# Patient Record
Sex: Female | Born: 1980 | Race: White | Hispanic: No | Marital: Married | Smoking: Former smoker
Health system: Southern US, Community
[De-identification: ages and names within clinical notes are randomized; demographics above are authoritative.]

## PROBLEM LIST (undated history)

## (undated) DIAGNOSIS — Z9889 Other specified postprocedural states: Secondary | ICD-10-CM

## (undated) DIAGNOSIS — J189 Pneumonia, unspecified organism: Secondary | ICD-10-CM

## (undated) DIAGNOSIS — C73 Malignant neoplasm of thyroid gland: Secondary | ICD-10-CM

## (undated) DIAGNOSIS — E039 Hypothyroidism, unspecified: Secondary | ICD-10-CM

## (undated) DIAGNOSIS — D649 Anemia, unspecified: Secondary | ICD-10-CM

## (undated) DIAGNOSIS — T8859XA Other complications of anesthesia, initial encounter: Secondary | ICD-10-CM

## (undated) DIAGNOSIS — T4145XA Adverse effect of unspecified anesthetic, initial encounter: Secondary | ICD-10-CM

## (undated) DIAGNOSIS — R112 Nausea with vomiting, unspecified: Secondary | ICD-10-CM

## (undated) HISTORY — PX: BILATERAL SALPINGECTOMY: SHX5743

## (undated) HISTORY — PX: WISDOM TOOTH EXTRACTION: SHX21

## (undated) HISTORY — DX: Malignant neoplasm of thyroid gland: C73

---

## 2001-03-22 ENCOUNTER — Other Ambulatory Visit: Admission: RE | Admit: 2001-03-22 | Discharge: 2001-03-22 | Payer: Self-pay | Admitting: Family Medicine

## 2001-03-22 ENCOUNTER — Other Ambulatory Visit: Admission: RE | Admit: 2001-03-22 | Discharge: 2001-03-22 | Payer: Self-pay | Admitting: *Deleted

## 2004-07-17 ENCOUNTER — Inpatient Hospital Stay: Payer: Self-pay

## 2005-12-15 ENCOUNTER — Inpatient Hospital Stay: Payer: Self-pay

## 2010-01-04 ENCOUNTER — Emergency Department: Payer: Self-pay | Admitting: Emergency Medicine

## 2012-07-18 HISTORY — PX: COLPOSCOPY: SHX161

## 2013-05-11 ENCOUNTER — Inpatient Hospital Stay: Payer: Self-pay | Admitting: Obstetrics and Gynecology

## 2013-05-11 LAB — CBC WITH DIFFERENTIAL/PLATELET
Basophil #: 0 x10 3/mm 3 (ref 0.0–0.1)
Basophil %: 0.6 %
Eosinophil #: 0.1 x10 3/mm 3 (ref 0.0–0.7)
Eosinophil %: 0.8 %
HCT: 32.3 % — ABNORMAL LOW (ref 35.0–47.0)
HGB: 10.7 g/dL — ABNORMAL LOW (ref 12.0–16.0)
Lymphocyte %: 18.5 %
Lymphs Abs: 1.4 x10 3/mm 3 (ref 1.0–3.6)
MCH: 27 pg (ref 26.0–34.0)
MCHC: 33.1 g/dL (ref 32.0–36.0)
MCV: 82 fL (ref 80–100)
Monocyte #: 0.6 x10 3/mm (ref 0.2–0.9)
Monocyte %: 8.3 %
Neutrophil #: 5.6 x10 3/mm 3 (ref 1.4–6.5)
Neutrophil %: 71.8 %
Platelet: 179 x10 3/mm 3 (ref 150–440)
RBC: 3.96 X10 6/mm 3 (ref 3.80–5.20)
RDW: 15.5 % — ABNORMAL HIGH (ref 11.5–14.5)
WBC: 7.8 x10 3/mm 3 (ref 3.6–11.0)

## 2013-05-13 LAB — HEMATOCRIT: HCT: 31.9 % — ABNORMAL LOW (ref 35.0–47.0)

## 2014-05-09 ENCOUNTER — Ambulatory Visit: Payer: Self-pay | Admitting: Family Medicine

## 2014-05-09 DIAGNOSIS — K760 Fatty (change of) liver, not elsewhere classified: Secondary | ICD-10-CM | POA: Insufficient documentation

## 2014-08-01 LAB — OB RESULTS CONSOLE RUBELLA ANTIBODY, IGM: Rubella: IMMUNE

## 2014-08-01 LAB — OB RESULTS CONSOLE PLATELET COUNT: PLATELETS: 224 10*3/uL

## 2014-08-01 LAB — OB RESULTS CONSOLE HGB/HCT, BLOOD
HCT: 38 %
Hemoglobin: 12.7 g/dL

## 2014-08-01 LAB — OB RESULTS CONSOLE HEPATITIS B SURFACE ANTIGEN: Hepatitis B Surface Ag: NEGATIVE

## 2014-08-01 LAB — OB RESULTS CONSOLE RPR: RPR: NONREACTIVE

## 2014-08-01 LAB — OB RESULTS CONSOLE HIV ANTIBODY (ROUTINE TESTING): HIV: NONREACTIVE

## 2014-08-01 LAB — OB RESULTS CONSOLE VARICELLA ZOSTER ANTIBODY, IGG: Varicella: IMMUNE

## 2014-08-06 DIAGNOSIS — E669 Obesity, unspecified: Secondary | ICD-10-CM | POA: Insufficient documentation

## 2014-08-06 DIAGNOSIS — R87618 Other abnormal cytological findings on specimens from cervix uteri: Secondary | ICD-10-CM | POA: Insufficient documentation

## 2014-08-06 DIAGNOSIS — E559 Vitamin D deficiency, unspecified: Secondary | ICD-10-CM | POA: Insufficient documentation

## 2014-10-18 DIAGNOSIS — C73 Malignant neoplasm of thyroid gland: Secondary | ICD-10-CM

## 2014-10-18 HISTORY — DX: Malignant neoplasm of thyroid gland: C73

## 2014-10-18 NOTE — L&D Delivery Note (Signed)
Delivery Note At 4:44 AM a viable female was delivered via Vaginal, Spontaneous Delivery (Presentation: Right Occiput Anterior).  APGAR: 8, 8; weight 9 lb 9 oz (4338 g).   Placenta status: Intact, Spontaneous.  Cord: 3 vessels with the following complications: None.  Cord pH: n/a  Quick second stage. Infant to maternal abdomen for delayed cord clamping, cut by FOB after pulsation stopped. Infant to skin-to-skin with mother.   84 Name: undecided Anesthesia: Spinal  Episiotomy: None Lacerations: 1st degree Suture Repair: N/A Est. Blood Loss (mL): 500  Mom to postpartum.  Baby to Couplet care / Skin to Skin.   Burlene Arnt 03/11/2015, 5:15 AM

## 2015-02-17 LAB — OB RESULTS CONSOLE GBS: GBS: NEGATIVE

## 2015-03-10 ENCOUNTER — Inpatient Hospital Stay
Admission: EM | Admit: 2015-03-10 | Discharge: 2015-03-13 | DRG: 775 | Disposition: A | Discharge status: Home / Self Care | Payer: 59 | Attending: Obstetrics and Gynecology | Admitting: Obstetrics and Gynecology

## 2015-03-10 DIAGNOSIS — O09523 Supervision of elderly multigravida, third trimester: Secondary | ICD-10-CM | POA: Diagnosis not present

## 2015-03-10 DIAGNOSIS — M549 Dorsalgia, unspecified: Secondary | ICD-10-CM | POA: Diagnosis not present

## 2015-03-10 DIAGNOSIS — Z3A39 39 weeks gestation of pregnancy: Secondary | ICD-10-CM | POA: Diagnosis present

## 2015-03-10 DIAGNOSIS — Z6841 Body Mass Index (BMI) 40.0 and over, adult: Secondary | ICD-10-CM | POA: Diagnosis not present

## 2015-03-10 DIAGNOSIS — O99214 Obesity complicating childbirth: Principal | ICD-10-CM | POA: Diagnosis present

## 2015-03-10 MED ORDER — OXYTOCIN 40 UNITS IN LACTATED RINGERS INFUSION - SIMPLE MED
1.0000 m[IU]/min | INTRAVENOUS | Status: DC
  Administered 2015-03-10: 1 m[IU]/min via INTRAVENOUS

## 2015-03-10 MED ORDER — LACTATED RINGERS IV SOLN
INTRAVENOUS | Status: DC
  Administered 2015-03-10: via INTRAVENOUS

## 2015-03-10 MED ORDER — OXYTOCIN 40 UNITS IN LACTATED RINGERS INFUSION - SIMPLE MED
INTRAVENOUS | Status: AC
  Administered 2015-03-10: 1 m[IU]/min via INTRAVENOUS
  Filled 2015-03-10: qty 1000

## 2015-03-10 MED ORDER — OXYTOCIN BOLUS FROM INFUSION
500.0000 mL | INTRAVENOUS | Status: DC
  Administered 2015-03-11: 500 mL via INTRAVENOUS

## 2015-03-10 MED ORDER — OXYTOCIN 40 UNITS IN LACTATED RINGERS INFUSION - SIMPLE MED
62.5000 mL/h | INTRAVENOUS | Status: DC
  Administered 2015-03-11: 40 [IU] via INTRAVENOUS

## 2015-03-10 MED ORDER — ACETAMINOPHEN 325 MG PO TABS: 650.0000 mg | ORAL_TABLET | ORAL | Status: DC | PRN

## 2015-03-10 MED ORDER — TERBUTALINE SULFATE 1 MG/ML IJ SOLN: 0.2500 mg | Freq: Once | INTRAMUSCULAR | Status: AC | PRN

## 2015-03-10 MED ORDER — LACTATED RINGERS IV SOLN: 500.0000 mL | INTRAVENOUS | Status: DC | PRN

## 2015-03-10 MED ORDER — THYROID 120 MG PO TABS
120.0000 mg | ORAL_TABLET | Freq: Every day | ORAL | Status: DC
  Filled 2015-03-10 (×2): qty 1

## 2015-03-10 NOTE — H&P (Signed)
Obstetric History and Physical  Brooke Gordon is a 34 y.o. [id number] with Estimated Date of Delivery: 03/14/15 per LMP & 8 wk [place] who presents at [redacted]w[redacted]d  presenting for IOL for BMI > 40 and h/o macrosomia. Patient states she has been having mild contractions since exam at office earlier today, no vaginal bleeding, intact membranes, with active fetal movement.    Prenatal Course Source of Care: [facility]  with onset of care at 8 weeks Pregnancy complications or risks:There are no active problems to display for this patient.  She plans to breastfeed She desires vasectomy for postpartum contraception.   Prenatal labs and studies: ABO, Rh: O neg  Antibody: neg  Rubella: Immune Varicella: Immune RPR:  NR HBsAg:  neg HIV: neg GC/CT: neg/neg GBS: neg 1 hr Glucola: 28 wk 1 hr= 150, 3 hr WNL except 1 value   Genetic screening: Negative Informaseq, XX    Prenatal Transfer Tool   PMHx: Hypothyroidism PSHx: wisdom teeth, colposcopy  OB History  Gravida Para Term Preterm AB SAB TAB Ectopic Multiple Living  5 4 4  0 0 0 0 0 0 4    # Outcome Date GA Lbr Len/2nd Weight Sex Delivery Anes PTL Lv  5 Current           4 Term 05/12/13 [redacted]w[redacted]d  4.309 kg (9 lb 8 oz) F Vag-Spont     3 Term 2007 [redacted]w[redacted]d  4.451 kg (9 lb 13 oz) M Vag-Spont     2 Term 2005 [redacted]w[redacted]d  4.366 kg (9 lb 10 oz) M Vag-Spont     1 Term 10/18/97 100w0d  3.912 kg (8 lb 10 oz) F Vag-Spont         History   Social History  . Marital Status: Single    Spouse Name: N/A  . Number of Children: N/A  . Years of Education: N/A   Social History Main Topics  . Smoking status: Not on file  . Smokeless tobacco: Not on file  . Alcohol Use: Not on file  . Drug Use: Not on file  . Sexual Activity: Not on file   Other Topics Concern  . None   Social History Narrative  . None  Denies tobacco, etoh or drug use  No family history on file.  Prescriptions prior to admission  Medication Sig Dispense Refill Last Dose  . Prenatal Vit-Fe  Fumarate-FA (MULTIVITAMIN-PRENATAL) 27-0.8 MG TABS tablet Take 1 tablet by mouth daily at 12 noon.   03/10/2015 at Unknown time  . thyroid (ARMOUR) 120 MG tablet Take 120 mg by mouth daily before breakfast.   03/10/2015 at Unknown time    Allergies: Codeine, intolerance to any narcotics  Review of Systems: Negative except for what is mentioned in HPI.  Physical Exam: LMP 06/07/2014 GENERAL: Well-developed, well-nourished female in no acute distress.  LUNGS: Clear to auscultation bilaterally.  HEART: Regular rate and rhythm. ABDOMEN: Soft, nontender, nondistended, gravid. EXTREMITIES: Nontender, no edema Cervical Exam: Dilatation 4 cm   Effacement 75%   Station -2, ballotable  (changed from 1-2/75/-3 in office) Presentation: cephalic FHT: Category: 1 Baseline rate 145 bpm   Variability moderate  Accelerations present   Decelerations none Contractions: Every  2-5  mins   Pertinent Labs/Studies:   No results found for this or any previous visit (from the past 24 hour(s)).  Assessment : IUP at [redacted]w[redacted]d early labor, scheduled for IOL for BMI > 40 and h/o macrosomia  Plan: Given change in cervix from previous exam, offered  pt to let her rest and start IOL with pitocin overnight. Pt declines and agrees to start Pitocin tonight.  Epidural when appropriate, declines narcotics in labor or postpartum d/t intolerance Anticipate vaginal delivery

## 2015-03-11 ENCOUNTER — Inpatient Hospital Stay: Payer: 59 | Admitting: Anesthesiology

## 2015-03-11 ENCOUNTER — Encounter: Payer: Self-pay | Admitting: Anesthesiology

## 2015-03-11 LAB — CBC
HEMATOCRIT: 35.2 % (ref 35.0–47.0)
Hemoglobin: 11.3 g/dL — ABNORMAL LOW (ref 12.0–16.0)
MCH: 26.1 pg (ref 26.0–34.0)
MCHC: 32.1 g/dL (ref 32.0–36.0)
MCV: 81 fL (ref 80.0–100.0)
Platelets: 195 10*3/uL (ref 150–440)
RBC: 4.35 MIL/uL (ref 3.80–5.20)
RDW: 14.8 % — AB (ref 11.5–14.5)
WBC: 11.6 10*3/uL — ABNORMAL HIGH (ref 3.6–11.0)

## 2015-03-11 LAB — SAMPLE TO BLOOD BANK

## 2015-03-11 MED ORDER — ONDANSETRON HCL 4 MG/2ML IJ SOLN
4.0000 mg | INTRAMUSCULAR | Status: DC | PRN
  Administered 2015-03-11: 4 mg via INTRAVENOUS
  Filled 2015-03-11: qty 2

## 2015-03-11 MED ORDER — DIBUCAINE 1 % RE OINT: 1.0000 "application " | TOPICAL_OINTMENT | RECTAL | Status: DC | PRN

## 2015-03-11 MED ORDER — IBUPROFEN 600 MG PO TABS
600.0000 mg | ORAL_TABLET | Freq: Four times a day (QID) | ORAL | Status: DC
  Administered 2015-03-11 – 2015-03-13 (×8): 600 mg via ORAL
  Filled 2015-03-11 (×9): qty 1

## 2015-03-11 MED ORDER — FERROUS SULFATE 325 (65 FE) MG PO TABS
325.0000 mg | ORAL_TABLET | Freq: Every day | ORAL | Status: DC
  Administered 2015-03-12: 325 mg via ORAL
  Filled 2015-03-11: qty 1

## 2015-03-11 MED ORDER — FENTANYL CITRATE (PF) 100 MCG/2ML IJ SOLN: 25.0000 ug | INTRAMUSCULAR | Status: DC | PRN

## 2015-03-11 MED ORDER — ACETAMINOPHEN 325 MG PO TABS
650.0000 mg | ORAL_TABLET | ORAL | Status: DC | PRN
Start: 2015-03-11 — End: 2015-03-13

## 2015-03-11 MED ORDER — CARBOPROST TROMETHAMINE 250 MCG/ML IM SOLN
INTRAMUSCULAR | Status: AC
  Filled 2015-03-11: qty 1

## 2015-03-11 MED ORDER — LACTATED RINGERS IV SOLN: INTRAVENOUS | Status: DC

## 2015-03-11 MED ORDER — SIMETHICONE 80 MG PO CHEW
80.0000 mg | CHEWABLE_TABLET | ORAL | Status: DC | PRN
  Filled 2015-03-11: qty 1

## 2015-03-11 MED ORDER — BENZOCAINE-MENTHOL 20-0.5 % EX AERO: 1.0000 "application " | INHALATION_SPRAY | CUTANEOUS | Status: DC | PRN

## 2015-03-11 MED ORDER — ONDANSETRON HCL 4 MG PO TABS
4.0000 mg | ORAL_TABLET | ORAL | Status: DC | PRN
  Filled 2015-03-11: qty 1

## 2015-03-11 MED ORDER — BUPIVACAINE HCL (PF) 0.75 % IJ SOLN
INTRAMUSCULAR | Status: DC | PRN
  Administered 2015-03-11: .5 mL via INTRATHECAL

## 2015-03-11 MED ORDER — OXYTOCIN 40 UNITS IN LACTATED RINGERS INFUSION - SIMPLE MED
INTRAVENOUS | Status: AC
  Administered 2015-03-11: 40 [IU] via INTRAVENOUS
  Filled 2015-03-11: qty 1000

## 2015-03-11 MED ORDER — ZOLPIDEM TARTRATE 5 MG PO TABS: 5.0000 mg | ORAL_TABLET | Freq: Every evening | ORAL | Status: DC | PRN

## 2015-03-11 MED ORDER — METHYLERGONOVINE MALEATE 0.2 MG/ML IJ SOLN
INTRAMUSCULAR | Status: AC
  Filled 2015-03-11: qty 1

## 2015-03-11 MED ORDER — ONDANSETRON HCL 4 MG/2ML IJ SOLN
INTRAMUSCULAR | Status: AC
  Administered 2015-03-11: 4 mg via INTRAVENOUS
  Filled 2015-03-11: qty 2

## 2015-03-11 MED ORDER — MISOPROSTOL 200 MCG PO TABS
ORAL_TABLET | ORAL | Status: AC
  Filled 2015-03-11: qty 4

## 2015-03-11 MED ORDER — ONDANSETRON HCL 4 MG/2ML IJ SOLN
4.0000 mg | Freq: Once | INTRAMUSCULAR | Status: DC | PRN
  Administered 2015-03-11: 4 mg via INTRAVENOUS

## 2015-03-11 MED ORDER — WITCH HAZEL-GLYCERIN EX PADS: 1.0000 "application " | MEDICATED_PAD | CUTANEOUS | Status: DC | PRN

## 2015-03-11 MED ORDER — OXYTOCIN 10 UNIT/ML IJ SOLN
INTRAMUSCULAR | Status: AC
  Filled 2015-03-11: qty 2

## 2015-03-11 MED ORDER — DIPHENHYDRAMINE HCL 25 MG PO CAPS: 25.0000 mg | ORAL_CAPSULE | Freq: Four times a day (QID) | ORAL | Status: DC | PRN

## 2015-03-11 MED ORDER — AMMONIA AROMATIC IN INHA
RESPIRATORY_TRACT | Status: AC
  Filled 2015-03-11: qty 10

## 2015-03-11 MED ORDER — LANOLIN HYDROUS EX OINT: TOPICAL_OINTMENT | CUTANEOUS | Status: DC | PRN

## 2015-03-11 MED ORDER — LIDOCAINE HCL (PF) 1 % IJ SOLN
INTRAMUSCULAR | Status: AC
  Filled 2015-03-11: qty 30

## 2015-03-11 MED ORDER — PRENATAL MULTIVITAMIN CH
1.0000 | ORAL_TABLET | Freq: Every day | ORAL | Status: DC
  Administered 2015-03-12 – 2015-03-13 (×2): 1 via ORAL
  Filled 2015-03-11 (×2): qty 1

## 2015-03-11 MED ORDER — DOCUSATE SODIUM 100 MG PO CAPS
100.0000 mg | ORAL_CAPSULE | Freq: Two times a day (BID) | ORAL | Status: DC | PRN
  Administered 2015-03-11 – 2015-03-12 (×2): 100 mg via ORAL
  Filled 2015-03-11 (×2): qty 1

## 2015-03-11 MED ORDER — FENTANYL 2.5 MCG/ML W/ROPIVACAINE 0.2% IN NS 100 ML EPIDURAL INFUSION (ARMC-ANES)
EPIDURAL | Status: AC
  Filled 2015-03-11: qty 100

## 2015-03-11 NOTE — Progress Notes (Signed)
L&D Note  03/11/2015 - 3:12 AM  34 y.o. [id number] [redacted]w[redacted]d   Ms. Brooke Gordon is admitted for IOL   Subjective:  Feeling some painful contractions  Objective:   Filed Vitals:   03/10/15 2122 03/10/15 2130 03/11/15 0055  BP: 130/62  116/63  Pulse: 98  75  Temp: 98.2 F (36.8 C)  97.9 F (36.6 C)  TempSrc: Oral  Oral  Resp: 18  18  Height:  5\' 6"  (1.676 m)   Weight:  116.121 kg (256 lb)     Current Vital Signs 24h Vital Sign Ranges  T 97.9 F (36.6 C) Temp  Avg: 98.1 F (36.7 C)  Min: 97.9 F (36.6 C)  Max: 98.2 F (36.8 C)  BP 116/63 mmHg BP  Min: 116/63  Max: 130/62  HR 75 Pulse  Avg: 86.5  Min: 75  Max: 98  RR 18 Resp  Avg: 18  Min: 18  Max: 18  SaO2   Not Delivered No Data Recorded       24 Hour I/O Current Shift I/O  Time Ins Outs        FHR: category 1, + accels, no decels Toco: q 5 min SVE: 5/75/-2   Assessment :  IUP at [redacted]w[redacted]d, IOL for BMI > 40 and h/o macrosomia    Plan:  AROM with clear fluid noted.  Epidural when desired Anticipate vaginal delivery soon  Burlene Arnt, [place]

## 2015-03-11 NOTE — Anesthesia Preprocedure Evaluation (Deleted)
Anesthesia Evaluation  Patient identified by MRN, date of birth, ID band  Reviewed: Allergy & Precautions, H&P , NPO status   Airway Mallampati: II  TM Distance: >3 FB Neck ROM: Full    Dental  (+) Chipped   Pulmonary          Cardiovascular     Neuro/Psych    GI/Hepatic   Endo/Other  Morbid obesity  Renal/GU      Musculoskeletal   Abdominal   Peds  Hematology   Anesthesia Other Findings   Reproductive/Obstetrics (+) Pregnancy                             Anesthesia Physical Anesthesia Plan  ASA: II  Anesthesia Plan: Spinal   Post-op Pain Management:    Induction:   Airway Management Planned:   Additional Equipment:   Intra-op Plan:   Post-operative Plan:   Informed Consent: I have reviewed the patients History and Physical, chart, labs and discussed the procedure including the risks, benefits and alternatives for the proposed anesthesia with the patient or authorized representative who has indicated his/her understanding and acceptance.     Plan Discussed with:   Anesthesia Plan Comments:         Anesthesia Quick Evaluation

## 2015-03-11 NOTE — Anesthesia Preprocedure Evaluation (Signed)
Anesthesia Evaluation  Patient identified by MRN, date of birth, ID band Patient awake    Reviewed: Allergy & Precautions, H&P , NPO status , Patient's Chart, lab work & pertinent test results  Airway Mallampati: II  TM Distance: >3 FB     Dental  (+) Chipped   Pulmonary          Cardiovascular     Neuro/Psych    GI/Hepatic   Endo/Other    Renal/GU      Musculoskeletal   Abdominal   Peds  Hematology  (+) anemia ,   Anesthesia Other Findings   Reproductive/Obstetrics (+) Pregnancy                             Anesthesia Physical Anesthesia Plan  ASA: II and emergent  Anesthesia Plan: Epidural   Post-op Pain Management:    Induction:   Airway Management Planned:   Additional Equipment:   Intra-op Plan:   Post-operative Plan:   Informed Consent: I have reviewed the patients History and Physical, chart, labs and discussed the procedure including the risks, benefits and alternatives for the proposed anesthesia with the patient or authorized representative who has indicated his/her understanding and acceptance.     Plan Discussed with:   Anesthesia Plan Comments:         Anesthesia Quick Evaluation

## 2015-03-11 NOTE — Discharge Summary (Signed)
Obstetrical Discharge Summary  Date of Admission: 03/10/2015 Date of Discharge: 03/13/2015  Primary OB: [facility]  Gestational Age at Delivery: [redacted]w[redacted]d   Antepartum complications: Obesity Reason for Admission: IOL for Obesity and h/o macrosomia Date of Delivery: 03/11/15  Delivered By: [facility] Delivery Type: spontaneous vaginal delivery Intrapartum complications/course: None Anesthesia: spinal Placenta: spontaneous Laceration: none  Episiotomy: none Baby: Liveborn female, APGARs8/8, weight 4355 g.   Post partum course: Routine postpartum course, meeting discharge goals by postpartum day #2.  She is voiding, ambulating, tolerating PO diet, pain well controlled with PO ibuprofen. Disposition: Home  Rh Immune globulin given: not applicable (Mom O neg, baby is A neg) Rubella vaccine given: immune Varicella: Immune Tdap vaccine given in AP or PP setting: yes Flu vaccine given in AP or PP setting: not applicable  Contraception: Husband plans vasectomy  Prenatal Labs: O neg//Rubella Immune/Varicella Immune/RPR negative//HIV negative/HepB Surface Ag negative//plans to breastfeed//female//circ not applicable  Plan:  Jarome Matin was discharged to home in good condition. Follow-up appointment with Burlene Arnt in 6 week  No future appointments. Need to be scheduled.  Discharge Medications:   Medication List    TAKE these medications        multivitamin-prenatal 27-0.8 MG Tabs tablet  Take 1 tablet by mouth daily.     thyroid 120 MG tablet  Commonly known as:  ARMOUR  Take 120 mg by mouth daily before breakfast.        Will Bonnet, MD, [facility] 03/13/2015 12:14 PM

## 2015-03-11 NOTE — Anesthesia Procedure Notes (Deleted)
Spinal Patient location during procedure: OB Staffing Anesthesiologist: Gunnar Bulla Performed by: anesthesiologist  Preanesthetic Checklist Completed: patient identified, site marked, surgical consent, pre-op evaluation, timeout performed, IV checked and risks and benefits discussed Spinal Block Patient position: sitting Prep: Betadine Patient monitoring: heart rate, cardiac monitor, continuous pulse ox and blood pressure Approach: midline Location: L3-4 Injection technique: single-shot Needle Needle type: Pencil-Tip  Needle gauge: 25 G Needle length: 9 cm Assessment Sensory level: T10

## 2015-03-12 LAB — HEMATOCRIT: HCT: 30.4 % — ABNORMAL LOW (ref 35.0–47.0)

## 2015-03-12 NOTE — Progress Notes (Signed)
Post Partum Day 1 from an uncomplicated svd Subjective: up ad lib, tolerating PO and reporting some back pain but states she is very tired and can't get comfortable.   Objective: Blood pressure 120/65, pulse 58, temperature 98 F (36.7 C), temperature source Oral, resp. rate 18, height 5\' 6"  (1.676 m), weight 116.121 kg (256 lb), last menstrual period 06/07/2014, SpO2 100 %, unknown if currently breastfeeding.  Physical Exam:  General: alert, cooperative, no distress and appears tired  OOB up in chair Lochia: appropriate Uterine Fundus: firm Incision: NA DVT Evaluation: No evidence of DVT seen on physical exam.   Recent Labs  03/10/15 2237 03/12/15 0524  HGB 11.3*  --   HCT 35.2 30.4*    Assessment/Plan: Plan for discharge tomorrow and Breastfeeding   LOS: 2 days   Louisa Second 03/12/2015, 12:21 PM

## 2015-03-12 NOTE — Anesthesia Procedure Notes (Signed)
Spinal Patient location during procedure: OR Staffing Anesthesiologist: Gunnar Bulla Performed by: anesthesiologist  Preanesthetic Checklist Completed: patient identified, site marked, surgical consent, pre-op evaluation, timeout performed, IV checked and risks and benefits discussed Spinal Block Patient position: sitting Prep: Betadine Patient monitoring: heart rate, cardiac monitor, continuous pulse ox and blood pressure Approach: midline Location: L3-4 Injection technique: single-shot Needle Needle type: Pencil-Tip  Needle gauge: 25 G Needle length: 9 cm Assessment Sensory level: T12

## 2015-03-13 NOTE — Progress Notes (Signed)
Patient understands all discharge instructions and the need to make follow up appointments. Patient discharge via wheelchair with auxillary. 

## 2015-03-13 NOTE — Discharge Instructions (Signed)
Bleeding: Your bleeding could continue up to 6 weeks, the flow should gradually decrease and the color should become dark then lightened over the next couple of weeks. If you notice you are bleeding heavily or passing clots larger than the size of your fist, PLEASE call your physician. No TAMPONS, DOUCHING, ENEMAS OR SEXUAL INTERCOURSE for 6 weeks.   Stitches: Shower daily with mild soap and water. Stitches will dissolve over the next couple of weeks, if you experience any discomfort in the vaginal area you may sit in warm water 15-20 minutes, 3-4 times per day. Just enough water to cover vaginal area.   AfterPains: This is the uterus contracting back to its normal position and size. Use medications prescribed or recommended by your physician to help relieve this discomfort.   Bowels/Hemorrhoids: Drink plenty of water and stay active. Increase fiber, fresh fruits and vegetables in your diet.   Rest/Activity: Rest when the baby is resting  Bathing: Shower daily!  Diet: Continue to eat extra calories until your follow up visit to help replenish nutrients and vitamins. If breastfeeding eat an extra [phone] and increase your fluid intake to 12 glasses a day.   Contraception: Consult with your physician on what method of birth control you would like to use.   Postpartum "BLUES": It is common to emotional days after delivery, however if it persist for greater than 2 weeks or if you feel concerned please let your physician know immediately. This is hormone driven and nothing you can control so please let someone know how you feel.  Follow Up Visit: Please schedule a follow up visit with your physician.

## 2015-06-23 ENCOUNTER — Inpatient Hospital Stay: Admission: RE | Admit: 2015-06-23 | Payer: 59 | Source: Ambulatory Visit

## 2015-06-24 ENCOUNTER — Encounter: Payer: Self-pay | Admitting: *Deleted

## 2015-06-24 ENCOUNTER — Other Ambulatory Visit: Payer: 59

## 2015-06-24 NOTE — Patient Instructions (Signed)
  Your procedure is scheduled on: 06-25-15 Report to [facility] @ 9:15 PER PT   Remember: Instructions that are not followed completely may result in serious medical risk, up to and including death, or upon the discretion of your surgeon and anesthesiologist your surgery may need to be rescheduled.    _X___ 1. Do not eat food or drink liquids after midnight. No gum chewing or hard candies.     _X___ 2. No Alcohol for 24 hours before or after surgery.   ____ 3. Bring all medications with you on the day of surgery if instructed.    ____ 4. Notify your doctor if there is any change in your medical condition     (cold, fever, infections).     Do not wear jewelry, make-up, hairpins, clips or nail polish.  Do not wear lotions, powders, or perfumes. You may wear deodorant.  Do not shave 48 hours prior to surgery. Men may shave face and neck.  Do not bring valuables to the hospital.    [facility] is not responsible for any belongings or valuables.               Contacts, dentures or bridgework may not be worn into surgery.  Leave your suitcase in the car. After surgery it may be brought to your room.  For patients admitted to the hospital, discharge time is determined by your treatment team.   Patients discharged the day of surgery will not be allowed to drive home.   Please read over the following fact sheets that you were given:      _X___ Take these medicines the morning of surgery with A SIP OF WATER:    1. ARMOUR THYROID  2.   3.   4.  5.  6.  ____ Fleet Enema (as directed)   ____ Use CHG Soap as directed  ____ Use inhalers on the day of surgery  ____ Stop metformin 2 days prior to surgery    ____ Take 1/2 of usual insulin dose the night before surgery and none on the morning of surgery.   ____ Stop Coumadin/Plavix/aspirin-N/A  ____ Stop Anti-inflammatories-NO NSAIDS OR ASA PRODUCTS-TYLENOL OK   ____ Stop supplements until after surgery.     ____ Bring C-Pap to the hospital.

## 2015-06-25 ENCOUNTER — Encounter: Payer: Self-pay | Admitting: *Deleted

## 2015-06-25 ENCOUNTER — Observation Stay
Admission: RE | Admit: 2015-06-25 | Discharge: 2015-06-26 | Disposition: A | Discharge status: Home / Self Care | Payer: 59 | Source: Ambulatory Visit | Attending: Otolaryngology | Admitting: Otolaryngology

## 2015-06-25 ENCOUNTER — Ambulatory Visit: Payer: 59 | Admitting: Anesthesiology

## 2015-06-25 ENCOUNTER — Encounter
Admission: RE | Disposition: A | Discharge status: Home / Self Care | Payer: Self-pay | Source: Ambulatory Visit | Attending: Otolaryngology

## 2015-06-25 DIAGNOSIS — Z825 Family history of asthma and other chronic lower respiratory diseases: Secondary | ICD-10-CM | POA: Insufficient documentation

## 2015-06-25 DIAGNOSIS — Z79899 Other long term (current) drug therapy: Secondary | ICD-10-CM | POA: Insufficient documentation

## 2015-06-25 DIAGNOSIS — C73 Malignant neoplasm of thyroid gland: Principal | ICD-10-CM | POA: Insufficient documentation

## 2015-06-25 DIAGNOSIS — Z811 Family history of alcohol abuse and dependence: Secondary | ICD-10-CM | POA: Diagnosis not present

## 2015-06-25 DIAGNOSIS — Z885 Allergy status to narcotic agent status: Secondary | ICD-10-CM | POA: Diagnosis not present

## 2015-06-25 DIAGNOSIS — Z87891 Personal history of nicotine dependence: Secondary | ICD-10-CM | POA: Insufficient documentation

## 2015-06-25 DIAGNOSIS — R490 Dysphonia: Secondary | ICD-10-CM | POA: Insufficient documentation

## 2015-06-25 DIAGNOSIS — E063 Autoimmune thyroiditis: Secondary | ICD-10-CM | POA: Insufficient documentation

## 2015-06-25 DIAGNOSIS — R200 Anesthesia of skin: Secondary | ICD-10-CM | POA: Insufficient documentation

## 2015-06-25 DIAGNOSIS — E041 Nontoxic single thyroid nodule: Secondary | ICD-10-CM | POA: Diagnosis present

## 2015-06-25 DIAGNOSIS — E042 Nontoxic multinodular goiter: Secondary | ICD-10-CM | POA: Diagnosis present

## 2015-06-25 HISTORY — DX: Anemia, unspecified: D64.9

## 2015-06-25 HISTORY — DX: Hypothyroidism, unspecified: E03.9

## 2015-06-25 HISTORY — PX: THYROIDECTOMY: SHX17

## 2015-06-25 LAB — POCT PREGNANCY, URINE: Preg Test, Ur: NEGATIVE

## 2015-06-25 LAB — CALCIUM: CALCIUM: 8.7 mg/dL — AB (ref 8.9–10.3)

## 2015-06-25 SURGERY — THYROIDECTOMY
Anesthesia: General | Wound class: Clean

## 2015-06-25 MED ORDER — ONDANSETRON HCL 4 MG/2ML IJ SOLN
INTRAMUSCULAR | Status: DC | PRN
  Administered 2015-06-25: 4 mg via INTRAVENOUS

## 2015-06-25 MED ORDER — PROPOFOL 10 MG/ML IV BOLUS
INTRAVENOUS | Status: DC | PRN
  Administered 2015-06-25: 200 mg via INTRAVENOUS

## 2015-06-25 MED ORDER — ACETAMINOPHEN 325 MG PO TABS
650.0000 mg | ORAL_TABLET | ORAL | Status: DC | PRN
  Administered 2015-06-25 – 2015-06-26 (×4): 650 mg via ORAL
  Filled 2015-06-25 (×4): qty 2

## 2015-06-25 MED ORDER — LACTATED RINGERS IV SOLN
INTRAVENOUS | Status: DC
  Administered 2015-06-25 (×2): via INTRAVENOUS

## 2015-06-25 MED ORDER — SODIUM CHLORIDE 0.9 % IJ SOLN
INTRAMUSCULAR | Status: AC
  Filled 2015-06-25: qty 10

## 2015-06-25 MED ORDER — LIDOCAINE-EPINEPHRINE (PF) 1 %-1:200000 IJ SOLN
INTRAMUSCULAR | Status: DC | PRN
  Administered 2015-06-25: 6 mL

## 2015-06-25 MED ORDER — EPHEDRINE SULFATE 50 MG/ML IJ SOLN
INTRAMUSCULAR | Status: DC | PRN
  Administered 2015-06-25 (×2): 5 mg via INTRAVENOUS
  Administered 2015-06-25: 10 mg via INTRAVENOUS

## 2015-06-25 MED ORDER — KCL IN DEXTROSE-NACL 20-5-0.45 MEQ/L-%-% IV SOLN
INTRAVENOUS | Status: DC
  Administered 2015-06-25 – 2015-06-26 (×2): via INTRAVENOUS
  Filled 2015-06-25 (×5): qty 1000

## 2015-06-25 MED ORDER — FENTANYL CITRATE (PF) 100 MCG/2ML IJ SOLN
25.0000 ug | INTRAMUSCULAR | Status: AC | PRN
  Administered 2015-06-25 (×6): 25 ug via INTRAVENOUS

## 2015-06-25 MED ORDER — ONDANSETRON HCL 4 MG PO TABS: 4.0000 mg | ORAL_TABLET | ORAL | Status: DC | PRN

## 2015-06-25 MED ORDER — BACITRACIN ZINC 500 UNIT/GM EX OINT
1.0000 "application " | TOPICAL_OINTMENT | Freq: Three times a day (TID) | CUTANEOUS | Status: DC
  Administered 2015-06-25 – 2015-06-26 (×3): 1 via TOPICAL

## 2015-06-25 MED ORDER — ACETAMINOPHEN 10 MG/ML IV SOLN
INTRAVENOUS | Status: DC | PRN
  Administered 2015-06-25: 1000 mg via INTRAVENOUS

## 2015-06-25 MED ORDER — FENTANYL CITRATE (PF) 250 MCG/5ML IJ SOLN
INTRAMUSCULAR | Status: DC | PRN
  Administered 2015-06-25: 150 ug via INTRAVENOUS
  Administered 2015-06-25 (×2): 50 ug via INTRAVENOUS
  Administered 2015-06-25: 100 ug via INTRAVENOUS

## 2015-06-25 MED ORDER — ACETAMINOPHEN 10 MG/ML IV SOLN
INTRAVENOUS | Status: AC
  Filled 2015-06-25: qty 100

## 2015-06-25 MED ORDER — LIDOCAINE-EPINEPHRINE (PF) 1 %-1:200000 IJ SOLN
INTRAMUSCULAR | Status: AC
  Filled 2015-06-25: qty 30

## 2015-06-25 MED ORDER — FAMOTIDINE 20 MG PO TABS
ORAL_TABLET | ORAL | Status: AC
  Administered 2015-06-25: 20 mg via ORAL
  Filled 2015-06-25: qty 1

## 2015-06-25 MED ORDER — FAMOTIDINE 20 MG PO TABS
20.0000 mg | ORAL_TABLET | Freq: Once | ORAL | Status: AC
  Administered 2015-06-25: 20 mg via ORAL

## 2015-06-25 MED ORDER — FENTANYL CITRATE (PF) 100 MCG/2ML IJ SOLN
INTRAMUSCULAR | Status: AC
  Administered 2015-06-25: 25 ug via INTRAVENOUS
  Filled 2015-06-25: qty 2

## 2015-06-25 MED ORDER — DOCUSATE SODIUM 100 MG PO CAPS
100.0000 mg | ORAL_CAPSULE | Freq: Two times a day (BID) | ORAL | Status: DC
  Administered 2015-06-25 – 2015-06-26 (×2): 100 mg via ORAL
  Filled 2015-06-25 (×2): qty 1

## 2015-06-25 MED ORDER — BACITRACIN ZINC 500 UNIT/GM EX OINT
TOPICAL_OINTMENT | CUTANEOUS | Status: AC
  Filled 2015-06-25: qty 28.35

## 2015-06-25 MED ORDER — PROMETHAZINE HCL 25 MG/ML IJ SOLN
INTRAMUSCULAR | Status: AC
  Administered 2015-06-25: 6.25 mg via INTRAVENOUS
  Filled 2015-06-25: qty 1

## 2015-06-25 MED ORDER — SODIUM CHLORIDE 0.9 % IR SOLN
Status: DC | PRN
  Administered 2015-06-25: 60 mL

## 2015-06-25 MED ORDER — DEXAMETHASONE SODIUM PHOSPHATE 10 MG/ML IJ SOLN
INTRAMUSCULAR | Status: DC | PRN
  Administered 2015-06-25: 10 mg via INTRAVENOUS

## 2015-06-25 MED ORDER — SUCCINYLCHOLINE CHLORIDE 20 MG/ML IJ SOLN
INTRAMUSCULAR | Status: DC | PRN
  Administered 2015-06-25: 100 mg via INTRAVENOUS

## 2015-06-25 MED ORDER — ONDANSETRON HCL 4 MG/2ML IJ SOLN
4.0000 mg | INTRAMUSCULAR | Status: DC | PRN
  Administered 2015-06-25 – 2015-06-26 (×3): 4 mg via INTRAVENOUS
  Filled 2015-06-25 (×3): qty 2

## 2015-06-25 MED ORDER — PROMETHAZINE HCL 25 MG/ML IJ SOLN
6.2500 mg | INTRAMUSCULAR | Status: DC | PRN
  Administered 2015-06-25: 6.25 mg via INTRAVENOUS

## 2015-06-25 MED ORDER — MORPHINE SULFATE (PF) 2 MG/ML IV SOLN
2.0000 mg | INTRAVENOUS | Status: DC | PRN
  Administered 2015-06-25 – 2015-06-26 (×3): 2 mg via INTRAVENOUS
  Filled 2015-06-25 (×3): qty 1

## 2015-06-25 MED ORDER — BACITRACIN 500 UNIT/GM EX OINT
TOPICAL_OINTMENT | CUTANEOUS | Status: DC | PRN
  Administered 2015-06-25: 1 via TOPICAL

## 2015-06-25 MED ORDER — LIDOCAINE HCL (CARDIAC) 20 MG/ML IV SOLN
INTRAVENOUS | Status: DC | PRN
  Administered 2015-06-25: 100 mg via INTRAVENOUS

## 2015-06-25 MED ORDER — MIDAZOLAM HCL 5 MG/5ML IJ SOLN
INTRAMUSCULAR | Status: DC | PRN
  Administered 2015-06-25: 2 mg via INTRAVENOUS

## 2015-06-25 SURGICAL SUPPLY — 33 items
BLADE SURG 15 STRL LF DISP TIS (BLADE) ×1 IMPLANT
BLADE SURG 15 STRL SS (BLADE) ×2
CANISTER SUCT 1200ML W/VALVE (MISCELLANEOUS) ×3 IMPLANT
CORD BIP STRL DISP 12FT (MISCELLANEOUS) ×3 IMPLANT
DRAIN TLS ROUND 10FR (DRAIN) ×6 IMPLANT
DRAPE MAG INST 16X20 L/F (DRAPES) ×3 IMPLANT
DRSG TEGADERM 2-3/8X2-3/4 SM (GAUZE/BANDAGES/DRESSINGS) ×3 IMPLANT
ELECT LARYNGEAL 6/7 (MISCELLANEOUS) ×3
ELECT LARYNGEAL 8/9 (MISCELLANEOUS)
ELECTRODE LARYNGEAL 6/7 (MISCELLANEOUS) ×1 IMPLANT
ELECTRODE LARYNGEAL 8/9 (MISCELLANEOUS) IMPLANT
FORCEPS JEWEL BIP 4-3/4 STR (INSTRUMENTS) ×3 IMPLANT
GLOVE BIO SURGEON STRL SZ7.5 (GLOVE) ×21 IMPLANT
GOWN STRL REUS W/ TWL LRG LVL3 (GOWN DISPOSABLE) ×4 IMPLANT
GOWN STRL REUS W/TWL LRG LVL3 (GOWN DISPOSABLE) ×8
HARMONIC SCALPEL FOCUS (MISCELLANEOUS) ×3 IMPLANT
HEMOSTAT SURGICEL 2X3 (HEMOSTASIS) ×3 IMPLANT
HOOK STAY BLUNT/RETRACTOR 5M (MISCELLANEOUS) ×3 IMPLANT
KIT RM TURNOVER STRD PROC AR (KITS) ×3 IMPLANT
LABEL OR SOLS (LABEL) ×3 IMPLANT
NS IRRIG 500ML POUR BTL (IV SOLUTION) ×3 IMPLANT
PACK HEAD/NECK (MISCELLANEOUS) ×3 IMPLANT
PAD GROUND ADULT SPLIT (MISCELLANEOUS) ×3 IMPLANT
PROBE NEUROSIGN BIPOL (MISCELLANEOUS) ×1 IMPLANT
PROBE NEUROSIGN BIPOLAR (MISCELLANEOUS) ×2
SPONGE KITTNER 5P (MISCELLANEOUS) ×12 IMPLANT
SPONGE XRAY 4X4 16PLY STRL (MISCELLANEOUS) IMPLANT
SUT PROLENE 3 0 PS 2 (SUTURE) ×3 IMPLANT
SUT SILK 2 0 (SUTURE) ×2
SUT SILK 2 0 SH (SUTURE) IMPLANT
SUT SILK 2-0 18XBRD TIE 12 (SUTURE) ×1 IMPLANT
SUT VIC AB 4-0 RB1 18 (SUTURE) ×3 IMPLANT
SYSTEM CHEST DRAIN TLS 7FR (DRAIN) IMPLANT

## 2015-06-25 NOTE — Progress Notes (Signed)
06/25/2015 5:29 PM  Brooke Gordon [id number]  Post-Op Day: 0    Temp:  [98 F (36.7 C)-100.5 F (38.1 C)] 98 F (36.7 C) (09/07 1600) Pulse Rate:  [40-119] 76 (09/07 1600) Resp:  [8-16] 14 (09/07 1600) BP: (124-155)/(53-89) 124/53 mmHg (09/07 1600) SpO2:  [18 %-99 %] 95 % (09/07 1600) Weight:  [103.42 kg (228 lb)-107.729 kg (237 lb 8 oz)] 107.729 kg (237 lb 8 oz) (09/07 1544),     Intake/Output Summary (Last 24 hours) at 06/25/15 1729 Last data filed at 06/25/15 1517  Gross per 24 hour  Intake   1200 ml  Output    464 ml  Net    736 ml    Results for orders placed or performed during the hospital encounter of 06/25/15 (from the past 24 hour(s))  Pregnancy, urine POC     Status: None   Collection Time: 06/25/15  9:33 AM  Result Value Ref Range   Preg Test, Ur NEGATIVE NEGATIVE    SUBJECTIVE:  Doing well after surgery. No hoarseness. Sore, but trying to avoid taking narcotics so she can nurse.  OBJECTIVE:  Neck wound clean/dry/intact with no swelling. Drains in place.  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   PLAN:  Will check serum calcium tonight and again in AM to monitor for any drop in levels. Can give morphine for breakthrough pain if needed.   [name] 06/25/2015, 5:29 PM

## 2015-06-25 NOTE — H&P (Signed)
History and physical reviewed and will be scanned in later. No change in medical status reported by the patient or family, appears stable for surgery. All questions regarding the procedure answered, and patient (or family if a child) expressed understanding of the procedure.  Brooke Gordon @TODAY@ 

## 2015-06-25 NOTE — Transfer of Care (Signed)
Immediate Anesthesia Transfer of Care Note  Patient: Brooke Gordon  Procedure(s) Performed: Procedure(s): Total thyroidectomy with laryngeal nerve monitoring  (N/A)  Patient Location: PACU  Anesthesia Type:General  Level of Consciousness: awake, alert  and oriented  Airway & Oxygen Therapy: Patient Spontanous Breathing and Patient connected to face mask oxygen  Post-op Assessment: Report given to RN and Post -op Vital signs reviewed and stable  Post vital signs: Reviewed and stable  Last Vitals:  Filed Vitals:   06/25/15 1418  BP: 151/79  Pulse: 81  Temp: 38.1 C  Resp: 11    Complications: No apparent anesthesia complications

## 2015-06-25 NOTE — Anesthesia Preprocedure Evaluation (Signed)
Anesthesia Evaluation  Patient identified by MRN, date of birth, ID band Patient awake    Reviewed: Allergy & Precautions, H&P , NPO status , Patient's Chart, lab work & pertinent test results, reviewed documented beta blocker date and time   History of Anesthesia Complications (+) history of anesthetic complications (family history of PONV)  Airway Mallampati: II  TM Distance: >3 FB Neck ROM: full    Dental no notable dental hx. (+) Teeth Intact   Pulmonary former smoker,    Pulmonary exam normal breath sounds clear to auscultation       Cardiovascular Exercise Tolerance: Good negative cardio ROS Normal cardiovascular exam Rhythm:regular Rate:Normal     Neuro/Psych negative neurological ROS  negative psych ROS   GI/Hepatic negative GI ROS, Neg liver ROS,   Endo/Other  Hypothyroidism   Renal/GU negative Renal ROS  negative genitourinary   Musculoskeletal   Abdominal   Peds  Hematology negative hematology ROS (+)   Anesthesia Other Findings Past Medical History:   Hypothyroidism                                               Anemia                                                       Reproductive/Obstetrics (+) Breast feeding                              Anesthesia Physical Anesthesia Plan  ASA: II  Anesthesia Plan: General   Post-op Pain Management:    Induction:   Airway Management Planned:   Additional Equipment:   Intra-op Plan:   Post-operative Plan:   Informed Consent: I have reviewed the patients History and Physical, chart, labs and discussed the procedure including the risks, benefits and alternatives for the proposed anesthesia with the patient or authorized representative who has indicated his/her understanding and acceptance.   Dental Advisory Given  Plan Discussed with: Anesthesiologist, CRNA and Surgeon  Anesthesia Plan Comments:         Anesthesia  Quick Evaluation

## 2015-06-25 NOTE — Anesthesia Procedure Notes (Signed)
Procedure Name: Intubation Date/Time: 06/25/2015 11:23 AM Performed by: Delaney Meigs Pre-anesthesia Checklist: Patient identified, Emergency Drugs available, Suction available, Patient being monitored and Timeout performed Patient Re-evaluated:Patient Re-evaluated prior to inductionOxygen Delivery Method: Circle system utilized Preoxygenation: Pre-oxygenation with 100% oxygen Intubation Type: IV induction Ventilation: Mask ventilation without difficulty Laryngoscope Size: Mac and 3 Grade View: Grade I Tube type: Oral Number of attempts: 1 Airway Equipment and Method: Stylet Placement Confirmation: ETT inserted through vocal cords under direct vision,  positive ETCO2 and breath sounds checked- equal and bilateral Secured at: 18 cm Tube secured with: Tape Dental Injury: Teeth and Oropharynx as per pre-operative assessment

## 2015-06-25 NOTE — Op Note (Signed)
06/25/2015  2:03 PM    Brooke Gordon  [id number]   Pre-Op Diagnosis:  Thyroid nodules  Post-op Diagnosis: Thyroid nodules  Procedure:   Total thyroidectomy  Surgeon:  [name], MD  First Assistant: Margaretha Sheffield, MD  Anesthesia:  General endotracheal  EBL:  25 cc  Complications:  None  Findings: Bilateral thyroid nodules   Procedure: After the patient was identified in holding and the procedure was reviewed.  The patient was taken to the operating room and with the patient in a comfortable supine position,  general endotracheal anesthesia was induced without difficulty.  A nerve monitor on the endotracheal tube was visualized to be between the cords at the time of intubation. A proper time-out was performed, confirming the operative site and procedure.  The patient was placed on a shoulder roll and position. Skin was injected with 1% lidocaine with epinephrine 1-200,000. The patient was then prepped and draped in the usual sterile fashion. A 15 blade was used to incise the skin carrying the incision down through the subcutaneous tissues. The platysma muscle was divided with the Bovie. The strap muscles were identified in the midline and divided in the midline, retracting them laterally. Small anterior jugular veins were either clamped and suture divided or divided with the Harmonic scalpel. The strap muscles were retracted laterally off of the capsule of the right lobe of the thyroid gland. This was then finger dissected to loosen loose attachments to the gland. Dissection proceeded superiorly, dissecting the superior pole of the gland. The superior pole vessels were divided with the Harmonic scalpel. A small parathyroid gland was visualized and preserved during this dissection. The superior pole was retracted inferiorly and dissection proceeded around the lateral aspect of the gland, dividing vascular attachments with the Harmonic scalpel. The inferior pole was dissected,  identifying a small structure consistent with the parathyroid, and the inferior pole vessels were divided with the Harmonic scalpel. Care was taken to divide all of these vessels right at the capsule of the gland. The gland was then retracted medially and delivered from the wound. Dissection along the trachea revealed the recurrent laryngeal nerve which stimulated properly. The gland was then dissected off of the trachea, dividing Berry's ligament with the nerve carefully protected.   Next the left lobe of the thyroid gland was dissected in the same fashion as described above. Once again the superior and inferior pole vessels were divided right at the capsule of the gland and structures consistent with parathyroid tissue were identified both superiorly and inferiorly. Retracting the gland medially the recurrent laryngeal nerve was identified and confirmed with the stimulator. This was then carefully protected as the gland was dissected away from the trachea and Berry's ligament divided. The left lobe was delivered and the whole gland sent as a specimen. The gland was marked with a stitch at the superior pole of the left lobe.  The wound was then irrigated and inspected for bleeding. #10 TLS drains were placed on either side of the trachea with the drains coming out through the skin just below the wound. The drains were secured with 4-0 Vicryl suture. Surgicel was placed on either side of the trachea to control minor oozing within the wound. The strap muscles were then reapproximated with 4-0 Vicryl suture. The platysma and subcutaneous tissues were also closed with 4-0 Vicryl suture. The skin was closed with a 3-0 running subcuticular Prolene suture. The patient was then returned to the anesthesiologist for awakening. The patient was  awakened and taken to recovery room in good condition postoperatively.  The patient was then returned to the anesthesiologist in good condition for awakening. The patient was  awakened and taken to the recovery room in good condition.   Disposition:   PACU then transferred to floor  Plan: The patient is to be admitted for observation and monitoring of serum calcium, with potential discharge tomorrow if serum calcium is stable overnight.  [name] 06/25/2015 2:03 PM

## 2015-06-26 DIAGNOSIS — C73 Malignant neoplasm of thyroid gland: Secondary | ICD-10-CM | POA: Diagnosis not present

## 2015-06-26 LAB — RENAL FUNCTION PANEL
Albumin: 3.8 g/dL (ref 3.5–5.0)
Anion gap: 6 (ref 5–15)
BUN: 11 mg/dL (ref 6–20)
CALCIUM: 8.8 mg/dL — AB (ref 8.9–10.3)
CO2: 26 mmol/L (ref 22–32)
CREATININE: 0.67 mg/dL (ref 0.44–1.00)
Chloride: 108 mmol/L (ref 101–111)
Glucose, Bld: 130 mg/dL — ABNORMAL HIGH (ref 65–99)
Phosphorus: 3.7 mg/dL (ref 2.5–4.6)
Potassium: 4 mmol/L (ref 3.5–5.1)
SODIUM: 140 mmol/L (ref 135–145)

## 2015-06-26 LAB — CALCIUM: CALCIUM: 8.5 mg/dL — AB (ref 8.9–10.3)

## 2015-06-26 LAB — MAGNESIUM: Magnesium: 1.8 mg/dL (ref 1.7–2.4)

## 2015-06-26 MED ORDER — TRAMADOL-ACETAMINOPHEN 37.5-325 MG PO TABS: 1.0000 | ORAL_TABLET | ORAL | Status: DC | PRN

## 2015-06-26 MED ORDER — CALCIUM CARBONATE ANTACID 500 MG PO CHEW
500.0000 mg | CHEWABLE_TABLET | ORAL | Status: AC
  Administered 2015-06-26: 500 mg via ORAL
  Filled 2015-06-26: qty 1
  Filled 2015-06-26: qty 2

## 2015-06-26 MED ORDER — DOCUSATE SODIUM 100 MG PO CAPS: 100.0000 mg | ORAL_CAPSULE | Freq: Two times a day (BID) | ORAL | Status: DC

## 2015-06-26 MED ORDER — TRAMADOL HCL 50 MG PO TABS
50.0000 mg | ORAL_TABLET | ORAL | Status: DC | PRN
  Administered 2015-06-26: 50 mg via ORAL
  Filled 2015-06-26: qty 1

## 2015-06-26 MED ORDER — TRAMADOL HCL 50 MG PO TABS: ORAL_TABLET | ORAL | Status: DC

## 2015-06-26 MED ORDER — CALCIUM CARBONATE 1250 (500 CA) MG PO TABS: 1.0000 | ORAL_TABLET | ORAL | Status: DC

## 2015-06-26 NOTE — Progress Notes (Signed)
Dr. Richardson Landry notified of pt. C/o numbness and tingling in hand, radiating past wrists and numbness in feet.  New orders written; Kiki Bivens K, RN;1:39 AM;06/26/2015

## 2015-06-26 NOTE — Progress Notes (Signed)
Brooke Gordon, Brooke Gordon [id number] June 09, 1981 Brooke Nearing, MD   SUBJECTIVE: This 34 y.o. year old female is status post THYROIDECTOMY. Sore and feels like she will need something stronger than Tylenol. She has some tingling in the arms that started last night. Improved some with extra calcium, but not completely resolved. Serum calcium is not really that low however.  Medications:  Current Facility-Administered Medications  Medication Dose Route Frequency Provider Last Rate Last Dose  . acetaminophen (TYLENOL) tablet 650 mg  650 mg Oral Q4H PRN Brooke Canterbury, MD   650 mg at 06/26/15 0230  . bacitracin ointment 1 application  1 application Topical 3 times per day Brooke Canterbury, MD   1 application at 93/57/01 [phone]  . dextrose 5 % and 0.45 % NaCl with KCl 20 mEq/L infusion   Intravenous Continuous Brooke Canterbury, MD 100 mL/hr at 06/26/15 0409    . docusate sodium (COLACE) capsule 100 mg  100 mg Oral BID Brooke Canterbury, MD   100 mg at 06/25/15 2129  . morphine 2 MG/ML injection 2-4 mg  2-4 mg Intravenous Q2H PRN Brooke Canterbury, MD   2 mg at 06/26/15 0631  . ondansetron (ZOFRAN) tablet 4 mg  4 mg Oral Q4H PRN Brooke Canterbury, MD       Or  . ondansetron [facility]) injection 4 mg  4 mg Intravenous Q4H PRN Brooke Canterbury, MD   4 mg at 06/26/15 0631   Facility-Administered Medications Ordered in Other Encounters  Medication Dose Route Frequency Provider Last Rate Last Dose  . bupivacaine (SENSORCAINE-MPF) 0.75 % injection    Anesthesia Intra-op Brooke Bulla, MD   0.5 mL at 03/11/15 0428  .  Medications Prior to Admission  Medication Sig Dispense Refill  . calcium carbonate (OS-CAL) 600 MG TABS tablet Take 600 mg by mouth 2 (two) times daily with a meal.    . thyroid (ARMOUR) 120 MG tablet Take 120 mg by mouth daily before breakfast.    . UNABLE TO FIND Take 1 tablet by mouth daily. Med Name: Anson Fret    . Prenatal Vit-Fe Fumarate-FA (MULTIVITAMIN-PRENATAL) 27-0.8 MG TABS tablet Take 1 tablet by mouth daily.        OBJECTIVE:  PHYSICAL EXAM  Vitals: Blood pressure 118/70, pulse 51, temperature 98.1 F (36.7 C), temperature source Oral, resp. rate 18, height 5\' 6"  (1.676 m), weight 107.729 kg (237 lb 8 oz), SpO2 97 %, currently breastfeeding.. General: Well-developed, Well-nourished in no acute distress Mood: Mood and affect well adjusted, pleasant and cooperative. Orientation: Grossly alert and oriented. Vocal Quality: No hoarseness. Communicates verbally. Neck: Supple and symmetric with no palpable masses, tenderness or crepitance. The trachea is midline. Thyroid incision looks good. No swelling. Sutures intact. Drains removed this AM. Respiratory: Normal respiratory effort without labored breathing. Neurologic: Cranial Nerves II through XII are grossly intact. Negative Chvostek's sign.   MEDICAL DECISION MAKING: Data Review:  Results for orders placed or performed during the hospital encounter of 06/25/15 (from the past 48 hour(s))  Pregnancy, urine POC     Status: None   Collection Time: 06/25/15  9:33 AM  Result Value Ref Range   Preg Test, Ur NEGATIVE NEGATIVE    Comment:        THE SENSITIVITY OF THIS METHODOLOGY IS >24 mIU/mL   Calcium     Status: Abnormal   Collection Time: 06/25/15  5:42 PM  Result Value Ref Range   Calcium 8.7 (L) 8.9 - 10.3 mg/dL  Calcium     Status: Abnormal  Collection Time: 06/26/15  4:38 AM  Result Value Ref Range   Calcium 8.5 (L) 8.9 - 10.3 mg/dL  . No results found..   ASSESSMENT / PLAN: Doing well overall. Will give Tramadol for pain and see how she does with that. Calcium slightly low, but not significantly decreased. Not clear whether that is the cause of the numbness in fingers. Will observe, recheck calcium later. Potential discharge later today.   Brooke Nearing, MD 06/26/2015 8:20 AM

## 2015-06-26 NOTE — Discharge Instructions (Signed)
Thyroidectomy °Care After °Refer to this sheet in the next few weeks. These instructions provide you with general information on caring for yourself after you leave the hospital. Your caregiver also may give you specific instructions. Your treatment has been planned according to the most current medical practices available, but problems sometimes occur. Call your caregiver if you have any problems or questions after your procedure. °HOME CARE INSTRUCTIONS  °· It is normal to be sore for a few weeks following surgery. See your caregiver if your pain seems to be getting worse rather than better. °· Only take over-the-counter or prescription medicines for pain, discomfort, or fever as directed by your caregiver. Avoid taking medicines that contain aspirin and ibuprofen because they increase the risk of bleeding. °· Shower rather than bathe until instructed otherwise by your caregiver. °· Change your bandages (dressings) as directed by your caregiver. °· You may resume a normal diet and activities as directed by your caregiver. °· Avoid lifting weight greater than 20 lb (9 kg) or participating in heavy exercise or contact sports for 10 days or as instructed by your caregiver. °· Make an appointment to see your caregiver for stitch (suture) or staple removal. °SEEK MEDICAL CARE IF:  °· You have increased bleeding from your wound. °· You have redness, swelling, or increasing pain from your wound or in your neck. °· There is pus coming from your wound. °· You have an oral temperature above 102° F (38.9° C). °· There is a bad smell coming from the wound or dressing. °· You develop lightheadedness or feel faint. °· You develop numbness, tingling, or muscle spasms in your arms, hands, feet, or face. °· You have difficulty swallowing. °SEEK IMMEDIATE MEDICAL CARE IF:  °· You develop a rash. °· You have difficulty breathing. °· You hear whistling noises that come from your chest. °· You develop a cough that becomes increasingly  worse. °· You develop any reaction or side effects to medicines given. °· There is swelling in your neck. °· You develop changes in speech or hoarseness, which is getting worse. °MAKE SURE YOU:  °· Understand these instructions. °· Will watch your condition. °· Will get help right away if you are not doing well or get worse. °Document Released: 04/23/2005 Document Revised: 12/27/2011 Document Reviewed: 12/11/2010 °Exit[employer]® Patient Information ©2015 [employer]. This information is not intended to replace advice given to you by your health care provider. Make sure you discuss any questions you have with your health care provider. ° °

## 2015-06-26 NOTE — Progress Notes (Signed)
Pt VSS, alert and oriented x4, pain managed with oral medication. IV removed, catheter intact.  Pt received discharge teaching and verbalized understanding. Discharged to home with husband. Pt left unit via wheelchair accompanied by staff.

## 2015-06-27 NOTE — Anesthesia Postprocedure Evaluation (Signed)
  Anesthesia Post-op Note  Patient: Brooke Gordon  Procedure(s) Performed: Procedure(s): Total thyroidectomy with laryngeal nerve monitoring  (N/A)  Anesthesia type:General  Patient location: PACU  Post pain: Pain level controlled  Post assessment: Post-op Vital signs reviewed, Patient's Cardiovascular Status Stable, Respiratory Function Stable, Patent Airway and No signs of Nausea or vomiting  Post vital signs: Reviewed and stable  Last Vitals:  Filed Vitals:   06/26/15 1129  BP: 115/56  Pulse: 55  Temp: 36.8 C  Resp: 17    Level of consciousness: awake, alert  and patient cooperative  Complications: No apparent anesthesia complications

## 2015-06-30 LAB — SURGICAL PATHOLOGY

## 2015-07-11 ENCOUNTER — Other Ambulatory Visit: Payer: Self-pay | Admitting: Internal Medicine

## 2015-07-11 DIAGNOSIS — C73 Malignant neoplasm of thyroid gland: Secondary | ICD-10-CM

## 2015-08-22 ENCOUNTER — Encounter
Admission: RE | Admit: 2015-08-22 | Discharge: 2015-08-22 | Disposition: A | Discharge status: Home / Self Care | Payer: 59 | Source: Ambulatory Visit | Attending: Internal Medicine | Admitting: Internal Medicine

## 2015-08-22 DIAGNOSIS — C73 Malignant neoplasm of thyroid gland: Secondary | ICD-10-CM | POA: Insufficient documentation

## 2015-08-22 MED ORDER — SODIUM IODIDE I 131 CAPSULE
53.0000 | Freq: Once | INTRAVENOUS | Status: AC | PRN
Start: 2015-08-22 — End: 2015-08-22
  Administered 2015-08-22: 53 via ORAL

## 2015-08-29 ENCOUNTER — Encounter
Admission: RE | Admit: 2015-08-29 | Discharge: 2015-08-29 | Disposition: A | Discharge status: Home / Self Care | Payer: 59 | Source: Ambulatory Visit | Attending: Internal Medicine | Admitting: Internal Medicine

## 2015-08-29 DIAGNOSIS — C73 Malignant neoplasm of thyroid gland: Secondary | ICD-10-CM | POA: Diagnosis not present

## 2015-11-25 ENCOUNTER — Other Ambulatory Visit: Payer: Self-pay | Admitting: Nurse Practitioner

## 2015-11-25 DIAGNOSIS — K760 Fatty (change of) liver, not elsewhere classified: Secondary | ICD-10-CM | POA: Diagnosis not present

## 2015-11-25 DIAGNOSIS — E890 Postprocedural hypothyroidism: Secondary | ICD-10-CM | POA: Diagnosis not present

## 2015-11-25 DIAGNOSIS — R748 Abnormal levels of other serum enzymes: Secondary | ICD-10-CM

## 2015-11-25 DIAGNOSIS — C73 Malignant neoplasm of thyroid gland: Secondary | ICD-10-CM | POA: Diagnosis not present

## 2015-12-02 ENCOUNTER — Ambulatory Visit
Admission: RE | Admit: 2015-12-02 | Discharge: 2015-12-02 | Disposition: A | Discharge status: Home / Self Care | Payer: 59 | Source: Ambulatory Visit | Attending: Nurse Practitioner | Admitting: Nurse Practitioner

## 2015-12-02 DIAGNOSIS — Z85850 Personal history of malignant neoplasm of thyroid: Secondary | ICD-10-CM | POA: Insufficient documentation

## 2015-12-02 DIAGNOSIS — R748 Abnormal levels of other serum enzymes: Secondary | ICD-10-CM | POA: Diagnosis not present

## 2015-12-02 DIAGNOSIS — K760 Fatty (change of) liver, not elsewhere classified: Secondary | ICD-10-CM | POA: Insufficient documentation

## 2015-12-08 DIAGNOSIS — E890 Postprocedural hypothyroidism: Secondary | ICD-10-CM | POA: Diagnosis not present

## 2015-12-08 DIAGNOSIS — C73 Malignant neoplasm of thyroid gland: Secondary | ICD-10-CM | POA: Insufficient documentation

## 2015-12-08 DIAGNOSIS — L719 Rosacea, unspecified: Secondary | ICD-10-CM | POA: Diagnosis not present

## 2016-02-24 DIAGNOSIS — L719 Rosacea, unspecified: Secondary | ICD-10-CM | POA: Diagnosis not present

## 2016-02-24 DIAGNOSIS — E890 Postprocedural hypothyroidism: Secondary | ICD-10-CM | POA: Diagnosis not present

## 2016-02-24 DIAGNOSIS — C73 Malignant neoplasm of thyroid gland: Secondary | ICD-10-CM | POA: Diagnosis not present

## 2016-02-26 DIAGNOSIS — E890 Postprocedural hypothyroidism: Secondary | ICD-10-CM | POA: Diagnosis not present

## 2016-04-02 DIAGNOSIS — L718 Other rosacea: Secondary | ICD-10-CM | POA: Diagnosis not present

## 2016-04-27 DIAGNOSIS — E890 Postprocedural hypothyroidism: Secondary | ICD-10-CM | POA: Diagnosis not present

## 2016-04-27 DIAGNOSIS — C73 Malignant neoplasm of thyroid gland: Secondary | ICD-10-CM | POA: Diagnosis not present

## 2016-05-07 DIAGNOSIS — Z6837 Body mass index (BMI) 37.0-37.9, adult: Secondary | ICD-10-CM | POA: Diagnosis not present

## 2016-05-07 DIAGNOSIS — Z124 Encounter for screening for malignant neoplasm of cervix: Secondary | ICD-10-CM | POA: Diagnosis not present

## 2016-05-07 DIAGNOSIS — E669 Obesity, unspecified: Secondary | ICD-10-CM | POA: Diagnosis not present

## 2016-05-07 DIAGNOSIS — Z3490 Encounter for supervision of normal pregnancy, unspecified, unspecified trimester: Secondary | ICD-10-CM | POA: Diagnosis not present

## 2016-05-07 DIAGNOSIS — Z113 Encounter for screening for infections with a predominantly sexual mode of transmission: Secondary | ICD-10-CM | POA: Diagnosis not present

## 2016-05-07 DIAGNOSIS — E890 Postprocedural hypothyroidism: Secondary | ICD-10-CM | POA: Diagnosis not present

## 2016-05-07 DIAGNOSIS — O99210 Obesity complicating pregnancy, unspecified trimester: Secondary | ICD-10-CM | POA: Diagnosis not present

## 2016-05-07 LAB — HM PAP SMEAR

## 2016-05-19 DIAGNOSIS — Z3A09 9 weeks gestation of pregnancy: Secondary | ICD-10-CM | POA: Diagnosis not present

## 2016-05-19 DIAGNOSIS — O09529 Supervision of elderly multigravida, unspecified trimester: Secondary | ICD-10-CM | POA: Diagnosis not present

## 2016-05-19 DIAGNOSIS — Z36 Encounter for antenatal screening of mother: Secondary | ICD-10-CM | POA: Diagnosis not present

## 2016-05-19 DIAGNOSIS — Z6837 Body mass index (BMI) 37.0-37.9, adult: Secondary | ICD-10-CM | POA: Diagnosis not present

## 2016-05-24 ENCOUNTER — Ambulatory Visit: Payer: 59

## 2016-05-27 ENCOUNTER — Inpatient Hospital Stay (HOSPITAL_BASED_OUTPATIENT_CLINIC_OR_DEPARTMENT_OTHER)
Admission: RE | Admit: 2016-05-27 | Discharge: 2016-05-27 | Disposition: A | Discharge status: Home / Self Care | Payer: 59 | Source: Ambulatory Visit

## 2016-05-27 ENCOUNTER — Ambulatory Visit
Admission: RE | Admit: 2016-05-27 | Discharge: 2016-05-27 | Disposition: A | Discharge status: Home / Self Care | Payer: 59 | Source: Ambulatory Visit | Attending: Maternal & Fetal Medicine | Admitting: Maternal & Fetal Medicine

## 2016-05-27 VITALS — BP 133/79 | HR 88 | Temp 98.4°F | Resp 18 | Wt 236.2 lb

## 2016-05-27 DIAGNOSIS — O09521 Supervision of elderly multigravida, first trimester: Secondary | ICD-10-CM

## 2016-05-27 DIAGNOSIS — Z85850 Personal history of malignant neoplasm of thyroid: Secondary | ICD-10-CM

## 2016-05-27 DIAGNOSIS — O09529 Supervision of elderly multigravida, unspecified trimester: Secondary | ICD-10-CM | POA: Insufficient documentation

## 2016-05-27 HISTORY — DX: Malignant neoplasm of thyroid gland: C73

## 2016-05-27 NOTE — Progress Notes (Signed)
MFM consultation  35 yo [id number] at 9w/6 days by LMP 03/18/2026 and EDD 12/23/2016 , referred for consultation due to [facility] and history of thyroid cancer ago and thyroidectomy (06/2015) followed by radioactive iodine. She completed radioactive iodine therapy 6 months ago.  She had a history of hypothryoidism since age 51 and was referred to endocrinology and ENT due to her primary physician's concern re difficulty controlling hypothryoidism with replacement therapy (she reports TSH as high as 100). She later had ultrasound and subsequent thyroidectomy that demonstrated thyroid cancer.   05/07/16 TSH 0.08  Past Medical History:  Diagnosis Date  . Anemia    pregnancy only  . Hypothyroidism    h/o thyroidectomy  . 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 THYROIDECTOMY N/A 06/25/2015   Procedure: Total thyroidectomy with laryngeal nerve monitoring ;  Surgeon: Clyde Canterbury, MD;  Location: [facility] ORS;  Service: ENT;  Laterality: N/A;  . WISDOM TOOTH EXTRACTION      Meds Synthroid 188mg/daily  Allergies  Allergen Reactions  . Codeine Other (See Comments)    Reaction:  Unknown    OB History    Gravida Para Term Preterm AB Living   '6 5 5 ' 0 0 1   SAB TAB Ectopic Multiple Live Births   0 0 0 0 1    SVD x 5--all uncomplicated Largest: 9lb 14 oz Smallest 8lb 1 oz  last delivery 02/2015  Family history DM (father--Etoh induced) No birth defects (first cousin on husband's side with Trisomy 250 Social history She is an RTherapist, sportsin ortho. 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 We discussed the minimal risks to the fetus given the half life of iodine.  After  131I treatment most commonly women may experience transient subfertility in the months after treatment. 131I Treatment is not associated with adverse  pregnancy outcomes after ~688monthfollowing therapy.   --She will continue to follow up with her endocrinologist--the goal is to maintain her in a slightly hyperthyroid state for suppression of thyroid cancer recurrence.  We addressed the increased metabolic demands of pregnancy and the general adjustment upwards of ~30% of thyroid hormone replacement during pregnancy (often by doubling dose twice weekly) --the goal for her TSH targets is good as a slight hyperthyroid state may confer benefits to fetal development whereas a hypothyroid state can be associated with poor fetal neurodevelopmental outcomes.  .   For pregnancy surveillance of her hypothyroidism in the setting of throidectomy/radioablation and AMA we addressed the following: --genetic counseling --she met with our genetic counselor today--please see that note for details --first trimester ultrasound (and likely cell free fetal DNA screening  --detailed anatomic survey at ~18 weeks --I would recommend fetal growth assessment at ~28 weeks and ~34 weeks --Further antenatal testing based on any additional pregnancy concerns (eg GDM or growth issues)  BMI 38 and history of macrosomia --baseline cmp and p;c ratio --early glucola --I think it is reasonable for her to being low dose aspirin after [redacted] weeks gestation for maternal benefit (preeclampsia risk reduction/ and h/o Ca)

## 2016-05-27 NOTE — Progress Notes (Signed)
Referring Provider: [facility] Length of Consultation: 50 minutes  Ms. Chao was referred to [facility] for genetic counseling because of advanced maternal age.  The patient will be 35 years old at the time of delivery.  This note summarizes the information we discussed.    We explained that the chance of a chromosome abnormality increases with maternal age.  Chromosomes and examples of chromosome problems were reviewed.  Humans typically have 46 chromosomes in each cell, with half passed through each sperm and egg.  Any change in the number or structure of chromosomes can increase the risk of problems in the physical and mental development of a pregnancy.   Based upon age of the patient, the chance of any chromosome abnormality was 1 in 59. The chance of Down syndrome, the most common chromosome problem associated with maternal age, was 1 in 93.  The risk of chromosome problems is in addition to the 3% general population risk for birth defects and mental retardation.  The greatest chance, of course, is that the baby would be born in good health.  We discussed the following prenatal screening and testing options for this pregnancy:  First trimester screening, which includes nuchal translucency ultrasound screen and first trimester maternal serum marker screening.  The nuchal translucency has approximately an 80% detection rate for Down syndrome and can be positive for other chromosome abnormalities as well as heart defects.  When combined with a maternal serum marker screening, the detection rate is up to 90% for Down syndrome and up to 97% for trisomy 18.     The chorionic villus sampling procedure is available for first trimester chromosome analysis.  This involves the withdrawal of a small amount of chorionic villi (tissue from the developing placenta).  Risk of pregnancy loss is estimated to be approximately 1 in 200 to 1 in 100 (0.5 to 1%).  There is approximately a 1% (1  in 100) chance that the CVS chromosome results will be unclear.  Chorionic villi cannot be tested for neural tube defects.     Maternal serum marker screening, a blood test that measures pregnancy proteins, can provide risk assessments for Down syndrome, trisomy 18, and open neural tube defects (spina bifida, anencephaly). Because it does not directly examine the fetus, it cannot positively diagnose or rule out these problems.  Targeted ultrasound uses high frequency sound waves to create an image of the developing fetus.  An ultrasound is often recommended as a routine means of evaluating the pregnancy.  It is also used to screen for fetal anatomy problems (for example, a heart defect) that might be suggestive of a chromosomal or other abnormality.   Amniocentesis involves the removal of a small amount of amniotic fluid from the sac surrounding the fetus with the use of a thin needle inserted through the maternal abdomen and uterus.  Ultrasound guidance is used throughout the procedure.  Fetal cells from amniotic fluid are directly evaluated and > 99.5% of chromosome problems and > 98% of open neural tube defects can be detected. This procedure is generally performed after the 15th week of pregnancy.  The main risks to this procedure include complications leading to miscarriage in less than 1 in 200 cases (0.5%).  We also reviewed the availability of cell free fetal DNA testing from maternal blood to determine whether or not the baby may have either Down syndrome, trisomy 32, or trisomy 8.  This test utilizes a maternal blood sample and DNA sequencing technology to  isolate circulating cell free fetal DNA from maternal plasma.  The fetal DNA can then be analyzed for DNA sequences that are derived from the three most common chromosomes involved in aneuploidy, chromosomes 13, 18, and 21.  If the overall amount of DNA is greater than the expected level for any of these chromosomes, aneuploidy is suspected.   While we do not consider it a replacement for invasive testing and karyotype analysis, a negative result from this testing would be reassuring, though not a guarantee of a normal chromosome complement for the baby.  An abnormal result is certainly suggestive of an abnormal chromosome complement, though we would still recommend CVS or amniocentesis to confirm any findings from this testing.  Cystic Fibrosis and Spinal Muscular Atrophy (SMA) screening were also discussed with the patient. Both conditions are recessive, which means that both parents must be carriers in order to have a child with the disease.  Cystic fibrosis (CF) is one of the most common genetic conditions in persons of Caucasian ancestry.  This condition occurs in approximately 1 in 2,500 Caucasian persons and results in thickened secretions in the lungs, digestive, and reproductive systems.  For a baby to be at risk for having CF, both of the parents must be carriers for this condition.  Approximately 1 in 28 Caucasian persons is a carrier for CF.  Current carrier testing looks for the most common mutations in the gene for CF and can detect approximately 90% of carriers in the Caucasian population.  This means that the carrier screening can greatly reduce, but cannot eliminate, the chance for an individual to have a child with CF.  If an individual is found to be a carrier for CF, then carrier testing would be available for the partner. As part of [place] newborn screening profile, all babies born in the state of [place] will have a two-tier screening process.  Specimens are first tested to determine the concentration of immunoreactive trypsinogen (IRT).  The top 5% of specimens with the highest IRT values then undergo DNA testing using a panel of over 40 common CF mutations. SMA is a neurodegenerative disorder that leads to atrophy of skeletal muscle and overall weakness.  This condition is also more prevalent in the Caucasian  population, with 1 in 40-1 in 60 persons being a carrier and 1 in 6,000-1 in 10,000 children being affected.  There are multiple forms of the disease, with some causing death in infancy to other forms with survival into adulthood.  The genetics of SMA is complex, but carrier screening can detect up to 95% of carriers in the Caucasian population.  Similar to CF, a negative result can greatly reduce, but cannot eliminate, the chance to have a child with SMA.  We obtained a detailed family history and pregnancy history.  The father of the baby, Jeneen Rinks, is reported to have one paternal first cousin with Down syndrome.  Down syndrome is caused by an extra copy of the genetic instructions located on chromosome number 21.  These extra instructions result in the characteristic facial appearance, intellectual disabilities, and an increased risk for some types of birth defects.  The majority (95%) of persons with Down syndrome have three freestanding copies of chromosome number 21, called trisomy 90.  This type of Down syndrome occurs as a sporadic condition and does not increase the chance for other family members to have Down syndrome.  Rarely, Down syndrome is caused by a rearrangement of the genetic instructions, where the extra  chromosome number 21 is attached to another chromosome.  This type of chromosome rearrangement can be passed down through families and therefore may increase the chance for Down syndrome in other family members.  We cannot determine which type of Down syndrome is present without documentation of chromosome studies in the affected family member.  If any additional information is obtained about this history, please do not hesitate to contact us.  The remainder of the family history is unremarkable for birth defects, mental retardation, recurrent pregnancy loss or known chromosome abnormalities.  Ms. Jeune reported that this is her sixth pregnancy.  She has three healthy children from her first  partner and two healthy daughters with her current husband.  While there have been no exposures or complications in the current pregnancy, she is concerned about her recent radiation treatment for thyroid cancer.  On August 22, 2015, she underwent treatment with radioactive Iodine for thyroid cancer.  Since that time, she has had no exposures.  We reviewed the literature regarding this exposure, as did Dr. Manfred Shirts (see MFM note from today).  There does not appear to be an increased risk for birth defects in pregnancies conceived after treatment with I131 based upon studies of human pregnancy.  One study suggested a possible increase in miscarriage, though this finding has not been replicated in other studies.  One Link Snuffer was that the risk for loss may be related to abnormal thyroid function.  It was suggested in that article that postponing pregnancy for 5 months to 1 year after treatment would all for excretion of residual radionucleotide and for stabilization of the thyroid.  Ms. Beldin was 7 months out from her treatment at the time she conceived, which would be within this timeframe.    We were reassuring that the current data does not suggest an increase in the number or the type of birth defects as a result of her treatment.  After consideration of the options, Ms. Balek expressed that she plans to continue with her desire for cell free fetal DNA testing at [facility] at the appropriate gestational age.  We would recommend a first trimester ultrasound to rule out cystic hygroma as well as a detailed anatomy ultrasound in the second trimester.  AFP only testing should be offered to assess for open neural tube defects.  Ms. Degross declined CF and SMA carrier screening.  Ms. Boling was encouraged to call with questions or concerns.  We can be contacted at [phone].   Wilburt Finlay, MS, CGC

## 2016-05-31 NOTE — Progress Notes (Signed)
Agree with assessment and plan as outlined in [place] note.

## 2016-06-14 DIAGNOSIS — Z6837 Body mass index (BMI) 37.0-37.9, adult: Secondary | ICD-10-CM | POA: Diagnosis not present

## 2016-06-14 DIAGNOSIS — O09529 Supervision of elderly multigravida, unspecified trimester: Secondary | ICD-10-CM | POA: Diagnosis not present

## 2016-06-14 DIAGNOSIS — Z3A12 12 weeks gestation of pregnancy: Secondary | ICD-10-CM | POA: Diagnosis not present

## 2016-06-14 DIAGNOSIS — O0940 Supervision of pregnancy with grand multiparity, unspecified trimester: Secondary | ICD-10-CM | POA: Diagnosis not present

## 2016-06-14 DIAGNOSIS — Z36 Encounter for antenatal screening of mother: Secondary | ICD-10-CM | POA: Diagnosis not present

## 2016-06-14 DIAGNOSIS — E669 Obesity, unspecified: Secondary | ICD-10-CM | POA: Diagnosis not present

## 2016-06-14 DIAGNOSIS — O99210 Obesity complicating pregnancy, unspecified trimester: Secondary | ICD-10-CM | POA: Diagnosis not present

## 2016-07-02 DIAGNOSIS — L718 Other rosacea: Secondary | ICD-10-CM | POA: Diagnosis not present

## 2016-07-11 ENCOUNTER — Emergency Department
Admission: EM | Admit: 2016-07-11 | Discharge: 2016-07-11 | Disposition: A | Discharge status: Home / Self Care | Payer: 59 | Attending: Emergency Medicine | Admitting: Emergency Medicine

## 2016-07-11 ENCOUNTER — Encounter: Payer: Self-pay | Admitting: Urgent Care

## 2016-07-11 ENCOUNTER — Emergency Department: Payer: 59

## 2016-07-11 DIAGNOSIS — R51 Headache: Secondary | ICD-10-CM | POA: Insufficient documentation

## 2016-07-11 DIAGNOSIS — E039 Hypothyroidism, unspecified: Secondary | ICD-10-CM | POA: Insufficient documentation

## 2016-07-11 DIAGNOSIS — O09521 Supervision of elderly multigravida, first trimester: Secondary | ICD-10-CM | POA: Diagnosis not present

## 2016-07-11 DIAGNOSIS — R519 Headache, unspecified: Secondary | ICD-10-CM

## 2016-07-11 DIAGNOSIS — Z3A14 14 weeks gestation of pregnancy: Secondary | ICD-10-CM | POA: Diagnosis not present

## 2016-07-11 DIAGNOSIS — Z87891 Personal history of nicotine dependence: Secondary | ICD-10-CM | POA: Insufficient documentation

## 2016-07-11 DIAGNOSIS — H53149 Visual discomfort, unspecified: Secondary | ICD-10-CM | POA: Diagnosis not present

## 2016-07-11 DIAGNOSIS — O26891 Other specified pregnancy related conditions, first trimester: Secondary | ICD-10-CM | POA: Insufficient documentation

## 2016-07-11 LAB — URINALYSIS COMPLETE WITH MICROSCOPIC (ARMC ONLY)
BILIRUBIN URINE: NEGATIVE
Bacteria, UA: NONE SEEN
Glucose, UA: NEGATIVE mg/dL
Hgb urine dipstick: NEGATIVE
Leukocytes, UA: NEGATIVE
NITRITE: NEGATIVE
PROTEIN: NEGATIVE mg/dL
SPECIFIC GRAVITY, URINE: 1.01 (ref 1.005–1.030)
pH: 7 (ref 5.0–8.0)

## 2016-07-11 LAB — COMPREHENSIVE METABOLIC PANEL
ALBUMIN: 3.4 g/dL — AB (ref 3.5–5.0)
ALK PHOS: 71 U/L (ref 38–126)
ALT: 10 U/L — ABNORMAL LOW (ref 14–54)
ANION GAP: 6 (ref 5–15)
AST: 14 U/L — ABNORMAL LOW (ref 15–41)
BUN: 6 mg/dL (ref 6–20)
CALCIUM: 8.3 mg/dL — AB (ref 8.9–10.3)
CO2: 22 mmol/L (ref 22–32)
Chloride: 107 mmol/L (ref 101–111)
Creatinine, Ser: 0.51 mg/dL (ref 0.44–1.00)
GLUCOSE: 106 mg/dL — AB (ref 65–99)
POTASSIUM: 3.6 mmol/L (ref 3.5–5.1)
Sodium: 135 mmol/L (ref 135–145)
TOTAL PROTEIN: 7.1 g/dL (ref 6.5–8.1)

## 2016-07-11 LAB — CBC
HEMATOCRIT: 32.3 % — AB (ref 35.0–47.0)
HEMOGLOBIN: 11.6 g/dL — AB (ref 12.0–16.0)
MCH: 31.6 pg (ref 26.0–34.0)
MCHC: 35.9 g/dL (ref 32.0–36.0)
MCV: 87.8 fL (ref 80.0–100.0)
Platelets: 175 10*3/uL (ref 150–440)
RBC: 3.68 MIL/uL — ABNORMAL LOW (ref 3.80–5.20)
RDW: 13.9 % (ref 11.5–14.5)
WBC: 8.7 10*3/uL (ref 3.6–11.0)

## 2016-07-11 MED ORDER — OXYCODONE-ACETAMINOPHEN 5-325 MG PO TABS
2.0000 | ORAL_TABLET | Freq: Once | ORAL | Status: AC
  Administered 2016-07-11: 2 via ORAL
  Filled 2016-07-11: qty 2

## 2016-07-11 MED ORDER — ONDANSETRON HCL 4 MG/2ML IJ SOLN
4.0000 mg | Freq: Once | INTRAMUSCULAR | Status: AC
  Administered 2016-07-11: 4 mg via INTRAVENOUS
  Filled 2016-07-11: qty 2

## 2016-07-11 MED ORDER — MORPHINE SULFATE (PF) 4 MG/ML IV SOLN
4.0000 mg | Freq: Once | INTRAVENOUS | Status: AC
  Administered 2016-07-11: 4 mg via INTRAVENOUS
  Filled 2016-07-11: qty 1

## 2016-07-11 MED ORDER — SODIUM CHLORIDE 0.9 % IV BOLUS (SEPSIS)
1000.0000 mL | Freq: Once | INTRAVENOUS | Status: AC
  Administered 2016-07-11: 1000 mL via INTRAVENOUS

## 2016-07-11 MED ORDER — OXYCODONE HCL 5 MG PO TABS: 5.0000 mg | ORAL_TABLET | ORAL | 0 refills | Status: AC | PRN

## 2016-07-11 MED ORDER — HYDROMORPHONE HCL 1 MG/ML IJ SOLN: 1.0000 mg | Freq: Once | INTRAMUSCULAR | Status: DC

## 2016-07-11 MED ORDER — PROMETHAZINE HCL 25 MG/ML IJ SOLN
12.5000 mg | Freq: Once | INTRAMUSCULAR | Status: AC
  Administered 2016-07-11: 12.5 mg via INTRAVENOUS
  Filled 2016-07-11: qty 1

## 2016-07-11 MED ORDER — ONDANSETRON 4 MG PO TBDP
4.0000 mg | ORAL_TABLET | Freq: Once | ORAL | Status: AC
  Administered 2016-07-11: 4 mg via ORAL

## 2016-07-11 MED ORDER — ONDANSETRON 4 MG PO TBDP
ORAL_TABLET | ORAL | Status: AC
  Filled 2016-07-11: qty 1

## 2016-07-11 NOTE — ED Notes (Signed)
Went to admit patient, patient states that she does not feel any better, EDP notified.

## 2016-07-11 NOTE — ED Notes (Signed)
Pt discharged to home.  Family member driving.  Discharge instructions reviewed.  Verbalized understanding.  No questions or concerns at this time.  Teach back verified.  Pt in NAD.  No items left in ED.   

## 2016-07-11 NOTE — ED Notes (Addendum)
Pt. States hx of HA's.  Pt. States this is the worse one.  Pt. States taking 4000 mg of tylenol from 5 am this morning and large dose of Ibuprofen today.  Pt. States neck pain, chills.  Pt. States 3 year daughter had cold and cough like symptoms 4 days ago.  Pt. In room with lights turned down and shirt over head.  Pt. States she is [redacted] weeks pregnant.

## 2016-07-11 NOTE — ED Provider Notes (Signed)
[facility] Emergency Department Provider Note    First MD Initiated Contact with Patient 07/11/16 0200     (approximate)  I have reviewed the triage vital signs and the nursing notes.   HISTORY  Chief Complaint Migraine and Chills   HPI Brooke Gordon is a 35 y.o. female G6 para 5 proximally [redacted] weeks pregnant presents to the emergency department with generalized headache times 1 day. Patient denies any weakness no numbness no gait instability or visual changes. Patient denies any fever afebrile on presentation were temperature 98.5. Patient does however admit to chills. Patient does admits to previous headaches however states that this is worse than previous. Positive photophobia positive phonophobia   Past Medical History:  Diagnosis Date  . Anemia    pregnancy only  . Hypothyroidism    h/o thyroidectomy  . 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 Advanced maternal age in multigravida 05/27/2016  . Thyroid nodule 06/25/2015  . Indication for care in labor and delivery, antepartum 03/10/2015    Past Surgical History:  Procedure Laterality Date  . THYROIDECTOMY N/A 06/25/2015   Procedure: Total thyroidectomy with laryngeal nerve monitoring ;  Surgeon: Clyde Canterbury, MD;  Location: [facility] ORS;  Service: ENT;  Laterality: N/A;  . WISDOM TOOTH EXTRACTION      Prior to Admission medications   Medication Sig Start Date End Date Taking? Authorizing Provider  levothyroxine (SYNTHROID, LEVOTHROID) 137 MCG tablet Take 137 mcg by mouth daily before breakfast.    Historical Provider, MD    Allergies Codeine  No family history on file.  Social History Social History  Substance Use Topics  . Smoking status: Former Smoker    Types: Cigarettes    Quit date: 06/23/2008  . Smokeless tobacco: Never Used  . Alcohol use No     Comment: occ wine    Review of Systems Constitutional: No  fever/chills Eyes: No visual changes. ENT: No sore throat. Cardiovascular: Denies chest pain. Respiratory: Denies shortness of breath. Gastrointestinal: No abdominal pain.  No nausea, no vomiting.  No diarrhea.  No constipation. Genitourinary: Negative for dysuria. Musculoskeletal: Negative for back pain. Skin: Negative for rash. Neurological:Positive for headache  10-point ROS otherwise negative.  ____________________________________________   PHYSICAL EXAM:  VITAL SIGNS: ED Triage Vitals  Enc Vitals Group     BP 07/11/16 0043 (!) 145/73     Pulse Rate 07/11/16 0043 84     Resp 07/11/16 0043 18     Temp 07/11/16 0043 98.5 F (36.9 C)     Temp Source 07/11/16 0043 Oral     SpO2 07/11/16 0043 97 %     Weight 07/11/16 0044 240 lb (108.9 kg)     Height 07/11/16 0044 5\' 6"  (1.676 m)     Head Circumference --      Peak Flow --      Pain Score 07/11/16 0044 10     Pain Loc --      Pain Edu? --      Excl. in [place]? --     Constitutional: Alert and oriented. Apparent discomfort Eyes: Conjunctivae are normal. PERRL. EOMI. Head: Atraumatic. Ears:  Healthy appearing ear canals and TMs bilaterally Nose: No congestion/rhinnorhea. Mouth/Throat: Mucous membranes are moist.  Oropharynx non-erythematous. Neck: No stridor.  No meningeal signs.  Cardiovascular: Normal rate, regular rhythm. Good peripheral circulation. Grossly normal heart sounds. Respiratory: Normal respiratory effort.  No retractions.  Lungs CTAB. Gastrointestinal: Soft and nontender. No distention.   Musculoskeletal: No lower extremity tenderness nor edema. No gross deformities of extremities. Neurologic:  Normal speech and language. No gross focal neurologic deficits are appreciated.  Skin:  Skin is warm, dry and intact. No rash noted. Psychiatric: Mood and affect are normal. Speech and behavior are normal.  ____________________________________________   LABS (all labs ordered are listed, but only abnormal results  are displayed)  Labs Reviewed - No data to display ____________________________________________   RADIOLOGY I, [place], personally viewed and evaluated these images (plain radiographs) as part of my medical decision making, as well as reviewing the written report by the radiologist.  Ct Head Wo Contrast  Result Date: 07/11/2016 CLINICAL DATA:  Intractable headaches. Worse than usual. Patient is [redacted] weeks pregnant. Shielded. EXAM: CT HEAD WITHOUT CONTRAST TECHNIQUE: Contiguous axial images were obtained from the base of the skull through the vertex without intravenous contrast. COMPARISON:  None. FINDINGS: Brain: No evidence of acute infarction, hemorrhage, hydrocephalus, extra-axial collection or mass lesion/mass effect. Vascular: No hyperdense vessel or unexpected calcification. Skull: Normal. Negative for fracture or focal lesion. Sinuses/Orbits: No acute finding. Other: None. IMPRESSION: No acute intracranial abnormalities. Electronically Signed   By: Lucienne Capers M.D.   On: 07/11/2016 02:06      Procedures     INITIAL IMPRESSION / ASSESSMENT AND PLAN / ED COURSE  Pertinent labs & imaging results that were available during my care of the patient were reviewed by me and considered in my medical decision making (see chart for details).  Given the severity of headache CT scan of the head performed which revealed no gross abnormality. I spoke with the patient at length regarding d discontinuing taking ibuprofen and the dangers of her taking ibuprofen while pregnant.   Clinical Course    ____________________________________________  FINAL CLINICAL IMPRESSION(S) / ED DIAGNOSES  Final diagnoses:  Acute nonintractable headache, unspecified headache type     MEDICATIONS GIVEN DURING THIS VISIT:  Medications  morphine 4 MG/ML injection 4 mg (4 mg Intravenous Given 07/11/16 0130)  ondansetron (ZOFRAN) injection 4 mg (4 mg Intravenous Given 07/11/16 0129)  sodium  chloride 0.9 % bolus 1,000 mL (1,000 mLs Intravenous New Bag/Given 07/11/16 0252)  promethazine (PHENERGAN) injection 12.5 mg (12.5 mg Intravenous Given 07/11/16 0253)     NEW OUTPATIENT MEDICATIONS STARTED DURING THIS VISIT:  New Prescriptions   No medications on file    Modified Medications   No medications on file    Discontinued Medications   No medications on file     Note:  This document was prepared using Dragon voice recognition software and may include unintentional dictation errors.    Gregor Hams, MD 07/11/16 [phone]

## 2016-07-11 NOTE — ED Triage Notes (Signed)
Patient presents with c/o migraine headache for several days. (+) chills and neck pain. Patient is a 14 week G6P5 - tried to call OB, but no one returned call.

## 2016-07-12 ENCOUNTER — Encounter: Payer: Self-pay | Admitting: Family Medicine

## 2016-07-12 ENCOUNTER — Ambulatory Visit (INDEPENDENT_AMBULATORY_CARE_PROVIDER_SITE_OTHER): Payer: 59 | Admitting: Family Medicine

## 2016-07-12 VITALS — BP 118/76 | HR 64 | Temp 98.2°F | Resp 14 | Wt 244.8 lb

## 2016-07-12 DIAGNOSIS — R51 Headache: Secondary | ICD-10-CM | POA: Diagnosis not present

## 2016-07-12 DIAGNOSIS — R87629 Unspecified abnormal cytological findings in specimens from vagina: Secondary | ICD-10-CM | POA: Insufficient documentation

## 2016-07-12 DIAGNOSIS — Z3490 Encounter for supervision of normal pregnancy, unspecified, unspecified trimester: Secondary | ICD-10-CM

## 2016-07-12 DIAGNOSIS — E039 Hypothyroidism, unspecified: Secondary | ICD-10-CM | POA: Insufficient documentation

## 2016-07-12 DIAGNOSIS — R519 Headache, unspecified: Secondary | ICD-10-CM

## 2016-07-12 DIAGNOSIS — D649 Anemia, unspecified: Secondary | ICD-10-CM | POA: Insufficient documentation

## 2016-07-12 LAB — POCT INFLUENZA A/B
Influenza A, POC: NEGATIVE
Influenza B, POC: NEGATIVE

## 2016-07-12 NOTE — Patient Instructions (Signed)

## 2016-07-12 NOTE — Progress Notes (Signed)
Patient: Brooke Gordon Female    DOB: August 06, 1981   35 y.o.   MRN: [id number] Visit Date: 07/12/2016  Today's Provider: Vernie Murders, PA   Chief Complaint  Patient presents with  . Headache  . Fatigue  . Dizziness  . Generalized Body Aches   Subjective:    HPI Patient reports Saturday morning she woke up with a headache. Patient describes headache as being the worse she has ever had. Patient states pain is radiating down back of neck into leg. Pain level is 8/10.   She said she took some Tylenol and Ibuprofen with no relief. Headache became worse as the day went along with chills, dizziness, neck stiffness and weakness. Patient then went to [facility] ER Saturday afternoon. Patient reports she was diagnosed with a migraine. She said she was given Morphine and Oxycodone at the ER which provided no relief. Started with body aches, nausea and headache at 5 am on 07-10-16. No relief from Ibuprofen and Tylenol. Went to the ER that night. CT scan and labs were normal.  Still has headache today at 7-8/10. Percocet allows her to fall asleep but headache and neck stiffness returns with body aches. States her 8 year old child had chills, body aches, fever, headache and nausea with vomiting recently.     Past Medical History:  Diagnosis Date  . Anemia    pregnancy only  . Hypothyroidism    h/o thyroidectomy  . 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 Abnormal vaginal Pap smear 07/12/2016  . Anemia 07/12/2016  . Hypothyroidism 07/12/2016  . Advanced maternal age in multigravida 05/27/2016  . Thyroid cancer ([place]) 12/08/2015  . Thyroid nodule 06/25/2015  . Indication for care in labor and delivery, antepartum 03/10/2015  . Non-atypical endometrial cells on cervical Pap smear 08/06/2014  . Obesity 08/06/2014  . Vitamin D deficiency 08/06/2014  . Hepatic steatosis 05/09/2014   Past Surgical History:  Procedure Laterality Date  .  THYROIDECTOMY N/A 06/25/2015   Procedure: Total thyroidectomy with laryngeal nerve monitoring ;  Surgeon: Clyde Canterbury, MD;  Location: [facility] ORS;  Service: ENT;  Laterality: N/A;  . WISDOM TOOTH EXTRACTION     No family history on file.   Allergies  Allergen Reactions  . Codeine Other (See Comments)    Reaction:  Unknown     Previous Medications   LEVOTHYROXINE (SYNTHROID, LEVOTHROID) 137 MCG TABLET    Take 137 mcg by mouth daily before breakfast.   OXYCODONE (ROXICODONE) 5 MG IMMEDIATE RELEASE TABLET    Take 1 tablet (5 mg total) by mouth every 4 (four) hours as needed.    Review of Systems  Constitutional: Positive for fatigue.  Respiratory: Negative.   Cardiovascular: Negative.   Musculoskeletal: Positive for myalgias.  Neurological: Positive for dizziness and headaches.    Social History  Substance Use Topics  . Smoking status: Former Smoker    Types: Cigarettes    Quit date: 06/23/2008  . Smokeless tobacco: Never Used  . Alcohol use No     Comment: occ wine   Objective:   BP 118/76 (BP Location: Right Arm, Patient Position: Sitting, Cuff Size: Large)   Pulse 64   Temp 98.2 F (36.8 C) (Oral)   Resp 14   Wt 244 lb 12.8 oz (111 kg)   LMP  (LMP Unknown)   BMI 39.51 kg/m   Physical Exam  Constitutional: She is oriented to person, place, and  time. She appears well-developed and well-nourished. No distress.  HENT:  Head: Normocephalic and atraumatic.  Right Ear: Hearing and external ear normal.  Left Ear: Hearing and external ear normal.  Nose: Nose normal.  Mouth/Throat: Oropharynx is clear and moist.  Generalized tenderness of head/scalp.  Eyes: Conjunctivae and lids are normal. Right eye exhibits no discharge. Left eye exhibits no discharge. No scleral icterus.  photosensitivity  Neck: Neck supple.  Cardiovascular: Normal rate and regular rhythm.   Pulmonary/Chest: Effort normal and breath sounds normal. No respiratory distress.  Abdominal: Soft. Bowel sounds  are normal.  Musculoskeletal: Normal range of motion.  Lymphadenopathy:    She has no cervical adenopathy.  Neurological: She is alert and oriented to person, place, and time.  Skin: Skin is intact. No lesion and no rash noted.  Psychiatric: She has a normal mood and affect. Her speech is normal and behavior is normal. Thought content normal.     Assessment & Plan:     1. Severe headache Onset over the past 1-2 days. CT scan and labs in the ER were normal. Flu test negative today. May use Oxycodone with Tylenol at home. Drink plenty of fluids. Will recheck CBC with diff. Concerned about recent care of patient with viral meningitis. Recheck pending report. - POCT Influenza A/B - CBC with Differential/Platelet  2. Pregnancy [redacted] weeks gestation. Has had one visit with obstetrician. No abdominal pain or vomiting. Recommend increase in fluid intake and she will notify obstetrician of headache.

## 2016-07-13 DIAGNOSIS — Z3A16 16 weeks gestation of pregnancy: Secondary | ICD-10-CM | POA: Diagnosis not present

## 2016-07-13 DIAGNOSIS — E669 Obesity, unspecified: Secondary | ICD-10-CM | POA: Diagnosis not present

## 2016-07-13 DIAGNOSIS — O0940 Supervision of pregnancy with grand multiparity, unspecified trimester: Secondary | ICD-10-CM | POA: Diagnosis not present

## 2016-07-13 DIAGNOSIS — Z6837 Body mass index (BMI) 37.0-37.9, adult: Secondary | ICD-10-CM | POA: Diagnosis not present

## 2016-07-13 DIAGNOSIS — O99210 Obesity complicating pregnancy, unspecified trimester: Secondary | ICD-10-CM | POA: Diagnosis not present

## 2016-07-13 DIAGNOSIS — O09529 Supervision of elderly multigravida, unspecified trimester: Secondary | ICD-10-CM | POA: Diagnosis not present

## 2016-07-13 LAB — CBC WITH DIFFERENTIAL/PLATELET
BASOS ABS: 0 10*3/uL (ref 0.0–0.2)
Basos: 0 %
EOS (ABSOLUTE): 0 10*3/uL (ref 0.0–0.4)
Eos: 1 %
HEMOGLOBIN: 11.2 g/dL (ref 11.1–15.9)
Hematocrit: 33.6 % — ABNORMAL LOW (ref 34.0–46.6)
Immature Grans (Abs): 0 10*3/uL (ref 0.0–0.1)
Immature Granulocytes: 0 %
LYMPHS ABS: 1.1 10*3/uL (ref 0.7–3.1)
LYMPHS: 18 %
MCH: 28.8 pg (ref 26.6–33.0)
MCHC: 33.3 g/dL (ref 31.5–35.7)
MCV: 86 fL (ref 79–97)
MONOCYTES: 7 %
Monocytes Absolute: 0.4 10*3/uL (ref 0.1–0.9)
Neutrophils Absolute: 4.5 10*3/uL (ref 1.4–7.0)
Neutrophils: 74 %
Platelets: 204 10*3/uL (ref 150–379)
RBC: 3.89 x10E6/uL (ref 3.77–5.28)
RDW: 14 % (ref 12.3–15.4)
WBC: 6.1 10*3/uL (ref 3.4–10.8)

## 2016-07-20 ENCOUNTER — Telehealth: Payer: Self-pay | Admitting: Family Medicine

## 2016-07-20 NOTE — Telephone Encounter (Signed)
Patient advised note is at the front desk ready for pick up.  

## 2016-07-20 NOTE — Telephone Encounter (Signed)
Pt needs a doctors note saying she was sick from the 23rd of Sept. To Sept 27th.  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    Please let her know if you can do this.  Her call back is [phone]  Thanks, Con Memos

## 2016-07-20 NOTE — Telephone Encounter (Signed)
Note is written and ready for pick up.

## 2016-08-12 DIAGNOSIS — O09529 Supervision of elderly multigravida, unspecified trimester: Secondary | ICD-10-CM | POA: Diagnosis not present

## 2016-08-12 DIAGNOSIS — E890 Postprocedural hypothyroidism: Secondary | ICD-10-CM | POA: Diagnosis not present

## 2016-08-12 DIAGNOSIS — Z6837 Body mass index (BMI) 37.0-37.9, adult: Secondary | ICD-10-CM | POA: Diagnosis not present

## 2016-08-12 DIAGNOSIS — Z369 Encounter for antenatal screening, unspecified: Secondary | ICD-10-CM | POA: Diagnosis not present

## 2016-08-12 DIAGNOSIS — Z3A21 21 weeks gestation of pregnancy: Secondary | ICD-10-CM | POA: Diagnosis not present

## 2016-08-20 DIAGNOSIS — E890 Postprocedural hypothyroidism: Secondary | ICD-10-CM | POA: Diagnosis not present

## 2016-08-20 DIAGNOSIS — C73 Malignant neoplasm of thyroid gland: Secondary | ICD-10-CM | POA: Diagnosis not present

## 2016-08-30 ENCOUNTER — Emergency Department: Payer: 59

## 2016-08-30 ENCOUNTER — Emergency Department
Admission: EM | Admit: 2016-08-30 | Discharge: 2016-08-30 | Disposition: A | Discharge status: Home / Self Care | Payer: 59 | Attending: Emergency Medicine | Admitting: Emergency Medicine

## 2016-08-30 DIAGNOSIS — Z3A24 24 weeks gestation of pregnancy: Secondary | ICD-10-CM | POA: Insufficient documentation

## 2016-08-30 DIAGNOSIS — E039 Hypothyroidism, unspecified: Secondary | ICD-10-CM | POA: Insufficient documentation

## 2016-08-30 DIAGNOSIS — Y999 Unspecified external cause status: Secondary | ICD-10-CM | POA: Insufficient documentation

## 2016-08-30 DIAGNOSIS — O9A212 Injury, poisoning and certain other consequences of external causes complicating pregnancy, second trimester: Secondary | ICD-10-CM | POA: Insufficient documentation

## 2016-08-30 DIAGNOSIS — S92411A Displaced fracture of proximal phalanx of right great toe, initial encounter for closed fracture: Secondary | ICD-10-CM | POA: Diagnosis not present

## 2016-08-30 DIAGNOSIS — Z87891 Personal history of nicotine dependence: Secondary | ICD-10-CM | POA: Diagnosis not present

## 2016-08-30 DIAGNOSIS — Y9389 Activity, other specified: Secondary | ICD-10-CM | POA: Diagnosis not present

## 2016-08-30 DIAGNOSIS — W109XXA Fall (on) (from) unspecified stairs and steps, initial encounter: Secondary | ICD-10-CM | POA: Insufficient documentation

## 2016-08-30 DIAGNOSIS — Y929 Unspecified place or not applicable: Secondary | ICD-10-CM | POA: Insufficient documentation

## 2016-08-30 DIAGNOSIS — Z85850 Personal history of malignant neoplasm of thyroid: Secondary | ICD-10-CM | POA: Insufficient documentation

## 2016-08-30 NOTE — ED Provider Notes (Signed)
[facility] Emergency Department Provider Note  ____________________________________________  Time seen: Approximately 3:36 PM  I have reviewed the triage vital signs and the nursing notes.   HISTORY  Chief Complaint Ankle Injury    HPI Brooke Gordon is a 35 y.o. female who presents emergency department complaining of right foot pain at the MCP joint of the great toe. Per the patient, she slipped going down her stairs and hit her foot last night. She is having severe pain to the area and is unable to bear weight on this area. Patient is [redacted] weeks pregnant and saw her OB/GYN today who recommends that she comes to the emergency department for x-rays. Patient denies any numbness or tingling in her toes. She denies any other injury or complaint. She is tried Tylenol with minimal relief of symptoms. No other medications prior to arrival.  Patient denies any abdominal trauma. She denies any abdominal pain. She denies any vaginal bleeding or discharge. She reports good fetal movement.   Past Medical History:  Diagnosis Date  . Anemia    pregnancy only  . Hypothyroidism    h/o thyroidectomy  . 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 Abnormal vaginal Pap smear 07/12/2016  . Anemia 07/12/2016  . Hypothyroidism 07/12/2016  . Advanced maternal age in multigravida 05/27/2016  . Thyroid cancer ([place]) 12/08/2015  . Thyroid nodule 06/25/2015  . Indication for care in labor and delivery, antepartum 03/10/2015  . Non-atypical endometrial cells on cervical Pap smear 08/06/2014  . Obesity 08/06/2014  . Vitamin D deficiency 08/06/2014  . Hepatic steatosis 05/09/2014    Past Surgical History:  Procedure Laterality Date  . THYROIDECTOMY N/A 06/25/2015   Procedure: Total thyroidectomy with laryngeal nerve monitoring ;  Surgeon: Clyde Canterbury, MD;  Location: [facility] ORS;  Service: ENT;  Laterality: N/A;  . WISDOM  TOOTH EXTRACTION      Prior to Admission medications   Medication Sig Start Date End Date Taking? Authorizing Provider  levothyroxine (SYNTHROID, LEVOTHROID) 137 MCG tablet Take 137 mcg by mouth daily before breakfast.    Historical Provider, MD    Allergies Codeine  No family history on file.  Social History Social History  Substance Use Topics  . Smoking status: Former Smoker    Types: Cigarettes    Quit date: 06/23/2008  . Smokeless tobacco: Never Used  . Alcohol use No     Comment: occ wine     Review of Systems  Constitutional: No fever/chills Cardiovascular: no chest pain. Respiratory: no cough. No SOB. Gastrointestinal: No abdominal pain.  No nausea, no vomiting.  Genitourinary: Negative for dysuria. No vaginal bleeding or discharge. Musculoskeletal: Positive for right foot pain. Skin: Negative for rash, abrasions, lacerations, ecchymosis. Neurological: Negative for headaches, focal weakness or numbness. 10-point ROS otherwise negative.  ____________________________________________   PHYSICAL EXAM:  VITAL SIGNS: ED Triage Vitals  Enc Vitals Group     BP 08/30/16 1436 132/62     Pulse Rate 08/30/16 1436 82     Resp 08/30/16 1436 16     Temp 08/30/16 1436 98.4 F (36.9 C)     Temp src --      SpO2 08/30/16 1436 98 %     Weight 08/30/16 1437 240 lb (108.9 kg)     Height 08/30/16 1437 5\' 6"  (1.676 m)     Head Circumference --      Peak Flow --  Pain Score 08/30/16 1437 5     Pain Loc --      Pain Edu? --      Excl. in [place]? --      Constitutional: Alert and oriented. Well appearing and in no acute distress. Eyes: Conjunctivae are normal. PERRL. EOMI. Head: Atraumatic. Neck: No stridor.    Cardiovascular: Normal rate, regular rhythm. Normal S1 and S2.  Good peripheral circulation. Respiratory: Normal respiratory effort without tachypnea or retractions. Lungs CTAB. Good air entry to the bases with no decreased or absent breath  sounds. Gastrointestinal: Bowel sounds 4 quadrants. Soft and nontender to palpation. No guarding or rigidity. No palpable masses. No distention.  Musculoskeletal: Full range of motion to all extremities. No gross deformities appreciated.No deformities noted to foot but inspection. Full range of motion of ankle. Patient does have ecchymosis overlying the MCP joint of the first digit of the foot. Patient is very tender to palpation over the joint. No palpable abnormality. Sensation and cap refill intact 5 digits. Neurologic:  Normal speech and language. No gross focal neurologic deficits are appreciated.  Skin:  Skin is warm, dry and intact. No rash noted. Psychiatric: Mood and affect are normal. Speech and behavior are normal. Patient exhibits appropriate insight and judgement.   ____________________________________________   LABS (all labs ordered are listed, but only abnormal results are displayed)  Labs Reviewed - No data to display ____________________________________________  EKG   ____________________________________________  RADIOLOGY Diamantina Providence Cuthriell, personally viewed and evaluated these images (plain radiographs) as part of my medical decision making, as well as reviewing the written report by the radiologist.  Dg Foot Complete Right  Result Date: 08/30/2016 CLINICAL DATA:  35 year old female fell down stairs last night. Medial pain. Initial encounter. EXAM: RIGHT FOOT COMPLETE - 3+ VIEW COMPARISON:  None. FINDINGS: Fracture right first proximal phalanx medial aspect with intra-articular extension No other fracture or dislocation noted. IMPRESSION: Fracture right first proximal phalanx medial aspect with intra-articular extension Electronically Signed   By: Genia Del M.D.   On: 08/30/2016 15:59    ____________________________________________    PROCEDURES  Procedure(s) performed:    Procedures    Medications - No data to  display   ____________________________________________   INITIAL IMPRESSION / ASSESSMENT AND PLAN / ED COURSE  Pertinent labs & imaging results that were available during my care of the patient were reviewed by me and considered in my medical decision making (see chart for details).  Review of the [place] CSRS was performed in accordance of the [place] prior to dispensing any controlled drugs.  Clinical Course     Patient's diagnosis is consistent with Fracture of the proximal phalanx first digit right foot. X-ray reveals the above diagnoses. Patient is neurovascularly intact distal to injury. Patient will be given postop shoe emergency Department and crutches for ambulation. Patient will follow-up with podiatry for further management. Patient is [redacted] weeks pregnant but denies any abdominal trauma, abdominal pain, vaginal discharge or bleeding. No indication for workup at this time..  Patient is given ED precautions to return to the ED for any worsening or new symptoms.     ____________________________________________  FINAL CLINICAL IMPRESSION(S) / ED DIAGNOSES  Final diagnoses:  Closed displaced fracture of proximal phalanx of right great toe, initial encounter      NEW MEDICATIONS STARTED DURING THIS VISIT:  Discharge Medication List as of 08/30/2016  4:39 PM          This chart was dictated using voice recognition software/Dragon.  Despite best efforts to proofread, errors can occur which can change the meaning. Any change was purely unintentional.    Darletta Moll, PA-C 08/30/16 [place], MD 08/30/16 2024

## 2016-08-30 NOTE — ED Triage Notes (Signed)
Pt reports fall down steps yesterday, sent over by PCP for Xray of R ankle. PT [redacted] weeks pregnant

## 2016-09-01 DIAGNOSIS — O99282 Endocrine, nutritional and metabolic diseases complicating pregnancy, second trimester: Secondary | ICD-10-CM | POA: Diagnosis not present

## 2016-09-01 DIAGNOSIS — E890 Postprocedural hypothyroidism: Secondary | ICD-10-CM | POA: Diagnosis not present

## 2016-09-01 DIAGNOSIS — C73 Malignant neoplasm of thyroid gland: Secondary | ICD-10-CM | POA: Diagnosis not present

## 2016-09-01 DIAGNOSIS — E079 Disorder of thyroid, unspecified: Secondary | ICD-10-CM | POA: Diagnosis not present

## 2016-09-08 DIAGNOSIS — S92411A Displaced fracture of proximal phalanx of right great toe, initial encounter for closed fracture: Secondary | ICD-10-CM | POA: Diagnosis not present

## 2016-09-28 DIAGNOSIS — E890 Postprocedural hypothyroidism: Secondary | ICD-10-CM | POA: Diagnosis not present

## 2016-09-29 DIAGNOSIS — M79671 Pain in right foot: Secondary | ICD-10-CM | POA: Diagnosis not present

## 2016-09-29 DIAGNOSIS — S92414D Nondisplaced fracture of proximal phalanx of right great toe, subsequent encounter for fracture with routine healing: Secondary | ICD-10-CM | POA: Diagnosis not present

## 2016-09-30 DIAGNOSIS — O99282 Endocrine, nutritional and metabolic diseases complicating pregnancy, second trimester: Secondary | ICD-10-CM | POA: Diagnosis not present

## 2016-09-30 DIAGNOSIS — E079 Disorder of thyroid, unspecified: Secondary | ICD-10-CM | POA: Diagnosis not present

## 2016-09-30 DIAGNOSIS — E890 Postprocedural hypothyroidism: Secondary | ICD-10-CM | POA: Diagnosis not present

## 2016-09-30 DIAGNOSIS — Z85850 Personal history of malignant neoplasm of thyroid: Secondary | ICD-10-CM | POA: Diagnosis not present

## 2016-10-07 DIAGNOSIS — O360110 Maternal care for anti-D [Rh] antibodies, first trimester, not applicable or unspecified: Secondary | ICD-10-CM | POA: Diagnosis not present

## 2016-10-07 DIAGNOSIS — Z113 Encounter for screening for infections with a predominantly sexual mode of transmission: Secondary | ICD-10-CM | POA: Diagnosis not present

## 2016-10-07 DIAGNOSIS — O99210 Obesity complicating pregnancy, unspecified trimester: Secondary | ICD-10-CM | POA: Diagnosis not present

## 2016-10-07 DIAGNOSIS — Z362 Encounter for other antenatal screening follow-up: Secondary | ICD-10-CM | POA: Diagnosis not present

## 2016-10-07 DIAGNOSIS — O09529 Supervision of elderly multigravida, unspecified trimester: Secondary | ICD-10-CM | POA: Diagnosis not present

## 2016-10-07 DIAGNOSIS — Z3A25 25 weeks gestation of pregnancy: Secondary | ICD-10-CM | POA: Diagnosis not present

## 2016-10-07 DIAGNOSIS — Z6837 Body mass index (BMI) 37.0-37.9, adult: Secondary | ICD-10-CM | POA: Diagnosis not present

## 2016-10-07 DIAGNOSIS — E669 Obesity, unspecified: Secondary | ICD-10-CM | POA: Diagnosis not present

## 2016-10-07 DIAGNOSIS — O0940 Supervision of pregnancy with grand multiparity, unspecified trimester: Secondary | ICD-10-CM | POA: Diagnosis not present

## 2016-10-07 DIAGNOSIS — Z3A29 29 weeks gestation of pregnancy: Secondary | ICD-10-CM | POA: Diagnosis not present

## 2016-10-18 NOTE — L&D Delivery Note (Addendum)
Delivery Note At 5:08 AM a viable female sex was delivered via Vaginal, Spontaneous Delivery (Presentation:OA).  APGAR: 8,9 ; weight  pending.   Placenta status:spontaneous, intact.  Cord: 3VC with the following complications: light meconium.  Cord pH: N/A  Anesthesia:  none Episiotomy:  none Lacerations:  1st degree hemostatic Suture Repair: none Est. Blood Loss (mL):  325mL  Mom to postpartum.  Baby to Couplet care / Skin to Skin.  Amdreas Nima Kemppainen 12/23/2016, 5:24 AM

## 2016-10-19 DIAGNOSIS — E890 Postprocedural hypothyroidism: Secondary | ICD-10-CM | POA: Diagnosis not present

## 2016-10-22 DIAGNOSIS — S92414D Nondisplaced fracture of proximal phalanx of right great toe, subsequent encounter for fracture with routine healing: Secondary | ICD-10-CM | POA: Diagnosis not present

## 2016-11-29 DIAGNOSIS — E890 Postprocedural hypothyroidism: Secondary | ICD-10-CM | POA: Diagnosis not present

## 2016-11-30 DIAGNOSIS — O0993 Supervision of high risk pregnancy, unspecified, third trimester: Secondary | ICD-10-CM | POA: Diagnosis not present

## 2016-11-30 DIAGNOSIS — Z3A36 36 weeks gestation of pregnancy: Secondary | ICD-10-CM | POA: Diagnosis not present

## 2016-11-30 DIAGNOSIS — Z6837 Body mass index (BMI) 37.0-37.9, adult: Secondary | ICD-10-CM | POA: Diagnosis not present

## 2016-11-30 DIAGNOSIS — E669 Obesity, unspecified: Secondary | ICD-10-CM | POA: Diagnosis not present

## 2016-11-30 DIAGNOSIS — Z113 Encounter for screening for infections with a predominantly sexual mode of transmission: Secondary | ICD-10-CM | POA: Diagnosis not present

## 2016-11-30 DIAGNOSIS — O09523 Supervision of elderly multigravida, third trimester: Secondary | ICD-10-CM | POA: Diagnosis not present

## 2016-11-30 DIAGNOSIS — O99213 Obesity complicating pregnancy, third trimester: Secondary | ICD-10-CM | POA: Diagnosis not present

## 2016-11-30 DIAGNOSIS — O0943 Supervision of pregnancy with grand multiparity, third trimester: Secondary | ICD-10-CM | POA: Diagnosis not present

## 2016-12-01 DIAGNOSIS — O99283 Endocrine, nutritional and metabolic diseases complicating pregnancy, third trimester: Secondary | ICD-10-CM | POA: Diagnosis not present

## 2016-12-01 DIAGNOSIS — Z85850 Personal history of malignant neoplasm of thyroid: Secondary | ICD-10-CM | POA: Diagnosis not present

## 2016-12-01 DIAGNOSIS — E079 Disorder of thyroid, unspecified: Secondary | ICD-10-CM | POA: Diagnosis not present

## 2016-12-01 DIAGNOSIS — E890 Postprocedural hypothyroidism: Secondary | ICD-10-CM | POA: Diagnosis not present

## 2016-12-06 DIAGNOSIS — S92414D Nondisplaced fracture of proximal phalanx of right great toe, subsequent encounter for fracture with routine healing: Secondary | ICD-10-CM | POA: Diagnosis not present

## 2016-12-17 ENCOUNTER — Encounter: Payer: 59 | Admitting: Certified Nurse Midwife

## 2016-12-17 DIAGNOSIS — O09529 Supervision of elderly multigravida, unspecified trimester: Secondary | ICD-10-CM | POA: Diagnosis not present

## 2016-12-17 DIAGNOSIS — E039 Hypothyroidism, unspecified: Secondary | ICD-10-CM | POA: Diagnosis not present

## 2016-12-17 DIAGNOSIS — Z6841 Body Mass Index (BMI) 40.0 and over, adult: Secondary | ICD-10-CM | POA: Diagnosis not present

## 2016-12-21 ENCOUNTER — Other Ambulatory Visit: Payer: Self-pay | Admitting: Obstetrics & Gynecology

## 2016-12-21 ENCOUNTER — Ambulatory Visit (INDEPENDENT_AMBULATORY_CARE_PROVIDER_SITE_OTHER): Payer: 59 | Admitting: Obstetrics & Gynecology

## 2016-12-21 ENCOUNTER — Ambulatory Visit: Payer: 59

## 2016-12-21 VITALS — BP 118/80 | Wt 269.0 lb

## 2016-12-21 DIAGNOSIS — Z3A39 39 weeks gestation of pregnancy: Secondary | ICD-10-CM

## 2016-12-21 DIAGNOSIS — E039 Hypothyroidism, unspecified: Secondary | ICD-10-CM

## 2016-12-23 ENCOUNTER — Inpatient Hospital Stay
Admission: EM | Admit: 2016-12-23 | Discharge: 2016-12-25 | DRG: 775 | Disposition: A | Discharge status: Home / Self Care | Payer: 59 | Attending: Obstetrics and Gynecology | Admitting: Obstetrics and Gynecology

## 2016-12-23 DIAGNOSIS — O99284 Endocrine, nutritional and metabolic diseases complicating childbirth: Secondary | ICD-10-CM | POA: Diagnosis present

## 2016-12-23 DIAGNOSIS — E039 Hypothyroidism, unspecified: Secondary | ICD-10-CM | POA: Diagnosis present

## 2016-12-23 DIAGNOSIS — Z6841 Body Mass Index (BMI) 40.0 and over, adult: Secondary | ICD-10-CM | POA: Diagnosis not present

## 2016-12-23 DIAGNOSIS — Z85850 Personal history of malignant neoplasm of thyroid: Secondary | ICD-10-CM | POA: Diagnosis not present

## 2016-12-23 DIAGNOSIS — O9902 Anemia complicating childbirth: Secondary | ICD-10-CM | POA: Diagnosis present

## 2016-12-23 DIAGNOSIS — Z87891 Personal history of nicotine dependence: Secondary | ICD-10-CM | POA: Diagnosis not present

## 2016-12-23 DIAGNOSIS — Z3A40 40 weeks gestation of pregnancy: Secondary | ICD-10-CM

## 2016-12-23 DIAGNOSIS — Z3493 Encounter for supervision of normal pregnancy, unspecified, third trimester: Secondary | ICD-10-CM | POA: Diagnosis present

## 2016-12-23 DIAGNOSIS — O09529 Supervision of elderly multigravida, unspecified trimester: Secondary | ICD-10-CM | POA: Diagnosis not present

## 2016-12-23 LAB — TYPE AND SCREEN
ABO/RH(D): O NEG
ANTIBODY SCREEN: NEGATIVE

## 2016-12-23 LAB — CBC
HEMATOCRIT: 31 % — AB (ref 35.0–47.0)
HEMOGLOBIN: 10 g/dL — AB (ref 12.0–16.0)
MCH: 24.5 pg — AB (ref 26.0–34.0)
MCHC: 32.2 g/dL (ref 32.0–36.0)
MCV: 76.1 fL — AB (ref 80.0–100.0)
Platelets: 207 10*3/uL (ref 150–440)
RBC: 4.07 MIL/uL (ref 3.80–5.20)
RDW: 15.7 % — ABNORMAL HIGH (ref 11.5–14.5)
WBC: 8.3 10*3/uL (ref 3.6–11.0)

## 2016-12-23 MED ORDER — LEVOTHYROXINE SODIUM 137 MCG PO TABS
137.0000 ug | ORAL_TABLET | Freq: Every day | ORAL | Status: DC
  Administered 2016-12-24 – 2016-12-25 (×2): 137 ug via ORAL
  Filled 2016-12-23 (×2): qty 1

## 2016-12-23 MED ORDER — ONDANSETRON HCL 4 MG/2ML IJ SOLN: 4.0000 mg | Freq: Four times a day (QID) | INTRAMUSCULAR | Status: DC | PRN

## 2016-12-23 MED ORDER — ONDANSETRON HCL 4 MG PO TABS: 4.0000 mg | ORAL_TABLET | ORAL | Status: DC | PRN

## 2016-12-23 MED ORDER — OXYTOCIN 40 UNITS IN LACTATED RINGERS INFUSION - SIMPLE MED
2.5000 [IU]/h | INTRAVENOUS | Status: DC
  Filled 2016-12-23: qty 1000

## 2016-12-23 MED ORDER — LACTATED RINGERS IV SOLN
INTRAVENOUS | Status: DC
  Administered 2016-12-23: 05:00:00 via INTRAVENOUS

## 2016-12-23 MED ORDER — SOD CITRATE-CITRIC ACID 500-334 MG/5ML PO SOLN
30.0000 mL | ORAL | Status: DC | PRN
  Filled 2016-12-23: qty 30

## 2016-12-23 MED ORDER — LACTATED RINGERS IV SOLN: 500.0000 mL | INTRAVENOUS | Status: DC | PRN

## 2016-12-23 MED ORDER — BENZOCAINE-MENTHOL 20-0.5 % EX AERO: 1.0000 "application " | INHALATION_SPRAY | CUTANEOUS | Status: DC | PRN

## 2016-12-23 MED ORDER — SENNOSIDES-DOCUSATE SODIUM 8.6-50 MG PO TABS
2.0000 | ORAL_TABLET | ORAL | Status: DC
  Administered 2016-12-24 – 2016-12-25 (×2): 2 via ORAL
  Filled 2016-12-23 (×2): qty 2

## 2016-12-23 MED ORDER — OXYTOCIN BOLUS FROM INFUSION
500.0000 mL | Freq: Once | INTRAVENOUS | Status: AC
  Administered 2016-12-23: 500 mL via INTRAVENOUS

## 2016-12-23 MED ORDER — COCONUT OIL OIL
1.0000 "application " | TOPICAL_OIL | Status: DC | PRN
  Administered 2016-12-24: 1 via TOPICAL
  Filled 2016-12-23: qty 120

## 2016-12-23 MED ORDER — IBUPROFEN 600 MG PO TABS
600.0000 mg | ORAL_TABLET | Freq: Four times a day (QID) | ORAL | Status: DC
  Administered 2016-12-24 – 2016-12-25 (×6): 600 mg via ORAL
  Filled 2016-12-23 (×6): qty 1

## 2016-12-23 MED ORDER — DIBUCAINE 1 % RE OINT: 1.0000 "application " | TOPICAL_OINTMENT | RECTAL | Status: DC | PRN

## 2016-12-23 MED ORDER — PRENATAL MULTIVITAMIN CH
1.0000 | ORAL_TABLET | Freq: Every day | ORAL | Status: DC
  Administered 2016-12-23 – 2016-12-24 (×2): 1 via ORAL
  Filled 2016-12-23 (×2): qty 1

## 2016-12-23 MED ORDER — ACETAMINOPHEN 325 MG PO TABS: 650.0000 mg | ORAL_TABLET | ORAL | Status: DC | PRN

## 2016-12-23 MED ORDER — SIMETHICONE 80 MG PO CHEW: 80.0000 mg | CHEWABLE_TABLET | ORAL | Status: DC | PRN

## 2016-12-23 MED ORDER — ONDANSETRON 4 MG PO TBDP
4.0000 mg | ORAL_TABLET | Freq: Once | ORAL | Status: AC
  Administered 2016-12-23: 4 mg via ORAL

## 2016-12-23 MED ORDER — DIPHENHYDRAMINE HCL 25 MG PO CAPS: 25.0000 mg | ORAL_CAPSULE | Freq: Four times a day (QID) | ORAL | Status: DC | PRN

## 2016-12-23 MED ORDER — TETANUS-DIPHTH-ACELL PERTUSSIS 5-2.5-18.5 LF-MCG/0.5 IM SUSP: 0.5000 mL | Freq: Once | INTRAMUSCULAR | Status: DC

## 2016-12-23 MED ORDER — WITCH HAZEL-GLYCERIN EX PADS: 1.0000 "application " | MEDICATED_PAD | CUTANEOUS | Status: DC | PRN

## 2016-12-23 MED ORDER — IBUPROFEN 600 MG PO TABS
600.0000 mg | ORAL_TABLET | Freq: Four times a day (QID) | ORAL | Status: DC
  Administered 2016-12-23 (×2): 600 mg via ORAL
  Filled 2016-12-23 (×3): qty 1

## 2016-12-23 MED ORDER — OXYCODONE-ACETAMINOPHEN 5-325 MG PO TABS: 2.0000 | ORAL_TABLET | ORAL | Status: DC | PRN

## 2016-12-23 MED ORDER — OXYCODONE-ACETAMINOPHEN 5-325 MG PO TABS: 1.0000 | ORAL_TABLET | ORAL | Status: DC | PRN

## 2016-12-23 MED ORDER — ONDANSETRON 4 MG PO TBDP
ORAL_TABLET | ORAL | Status: AC
  Administered 2016-12-23: 4 mg via ORAL
  Filled 2016-12-23: qty 1

## 2016-12-23 MED ORDER — RHO D IMMUNE GLOBULIN 1500 UNIT/2ML IJ SOSY
300.0000 ug | PREFILLED_SYRINGE | Freq: Once | INTRAMUSCULAR | Status: AC
  Administered 2016-12-24: 300 ug via INTRAVENOUS
  Filled 2016-12-23: qty 2

## 2016-12-23 MED ORDER — ONDANSETRON HCL 4 MG/2ML IJ SOLN: 4.0000 mg | INTRAMUSCULAR | Status: DC | PRN

## 2016-12-23 MED ORDER — LIDOCAINE HCL (PF) 1 % IJ SOLN
30.0000 mL | INTRAMUSCULAR | Status: DC | PRN
Start: 2016-12-23 — End: 2016-12-23

## 2016-12-23 MED ORDER — BUTORPHANOL TARTRATE 1 MG/ML IJ SOLN: 1.0000 mg | INTRAMUSCULAR | Status: DC | PRN

## 2016-12-23 NOTE — OB Triage Note (Signed)
Pt presents c/o regular ctx since this evening 3-5 mins apart. Pt c/o back pain and "a lot of pelvic and butt pressure". Denies any bleeding or LOF. Pt has had a little bit of nausea today as well. Reports positive fetal movement.

## 2016-12-23 NOTE — H&P (Signed)
Obstetric H&P   Chief Complaint: Contractions  Prenatal Care Provider: [facility]  History of Present Illness: 36 y.o. [id number] [redacted]w[redacted]d by LMP=9wk [place] derived EDD of 12/23/2016, presenting to L&D with contractions.    PNC notable for thyroidectomy and radioactive iodine 6 months prior to conception for thyroid malignancy.  AMA and grandmultiparity.  35lbs weight gain, pelvis tested to 9lbs 13oz, growth scan on 12/21/16 at 39 weeks 9lbs 13oz c/w 84%ile, AC 74%ile  O neg / ABSC neg / (received rhogam at 28 weeks / RI / VZI / RPR NR / HIV neg / HBsAg neg / Informaseq normal XX / 1-hr 120 / GBS neg.   No record of TDAP or influenza vaccination administrations antepartum  Review of Systems: 10 point review of systems negative unless otherwise noted in HPI  Past Medical History: Past Medical History:  Diagnosis Date  . Anemia    pregnancy only  . Hypothyroidism    h/o thyroidectomy  . Macrosomia   . Malignant neoplasm of thyroid gland ([place])   . 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 COLPOSCOPY  07/2012   neg  . THYROIDECTOMY N/A 06/25/2015   Procedure: Total thyroidectomy with laryngeal nerve monitoring ;  Surgeon: Clyde Canterbury, MD;  Location: [facility] ORS;  Service: ENT;  Laterality: N/A;  . WISDOM TOOTH EXTRACTION      Family History: History reviewed. No pertinent family history.  Social History: Social History   Social History  . Marital status: Married    Spouse name: N/A  . Number of children: N/A  . Years of education: N/A   Occupational History  . Not on file.   Social History Main Topics  . Smoking status: Former Smoker    Types: Cigarettes    Quit date: 06/23/2008  . Smokeless tobacco: Never Used  . Alcohol use No     Comment: occ wine  . Drug use: No  . Sexual activity: Yes   Other Topics Concern  . Not on file   Social History Narrative  . No narrative on file    Medications: Prior to  Admission medications   Medication Sig Start Date End Date Taking? Authorizing Provider  levothyroxine (SYNTHROID, LEVOTHROID) 137 MCG tablet Take 137 mcg by mouth daily before breakfast.   Yes Historical Provider, MD  liothyronine (CYTOMEL) 5 MCG tablet Take 10 mcg by mouth 2 (two) times daily.   Yes Historical Provider, MD    Allergies: Allergies  Allergen Reactions  . Codeine Other (See Comments)    Reaction:  Unknown     Physical Exam: Vitals: Blood pressure 138/73, pulse 80, temperature 98.2 F (36.8 C), temperature source Oral, resp. rate 18, height 5\' 6"  (1.676 m), weight 252 lb (114.3 kg), currently breastfeeding.  Urine Dip Protein: N/A  FHT: 150, moderate, +accels, no decels Toco: q2-17min  General: NAD HEENT: normocephalic, anicteric Pulmonary: No increased work of breathing Cardiovascular: RRR, distal pulses 2+ Abdomen: Gravid, non-tender [place]: vtx, 9lbs Genitourinary: 1979/07/04/-2, posterior Extremities: no edema, erythema, or tenderness Neurologic: Grossly intact Psychiatric: mood appropriate, affect full  Labs: No results found for this or any previous visit (from the past 24 hour(s)).  Assessment: 36 y.o. [id number] [redacted]w[redacted]d by LMP=9wk [place] derived EDD of  12/23/2016, presenting in term labor  Plan: 1) Labor - expectant management  2) Fetus - cat I tracing  3) PNL -O neg / ABSC neg / (received rhogam at 28 weeks /  RI / VZI / RPR NR / HIV neg / HBsAg neg / Informaseq normal XX / 1-hr 120 / GBS neg.  4) TDAP - did not receive antepartum  5) Disposition - pending delivery

## 2016-12-23 NOTE — Discharge Summary (Signed)
Obstetric Discharge Summary Reason for Admission: onset of labor Prenatal Procedures: none Intrapartum Procedures: spontaneous vaginal delivery Postpartum Procedures: none Complications-Operative and Postpartum: none   Hemoglobin  Date Value Ref Range Status  12/24/2016 9.0 (L) 12.0 - 16.0 g/dL Final  08/01/2014 12.7 g/dL Final   HCT  Date Value Ref Range Status  12/24/2016 27.8 (L) 35.0 - 47.0 % Final  08/01/2014 38 % Final   Hematocrit  Date Value Ref Range Status  07/12/2016 33.6 (L) 34.0 - 46.6 % Final    Physical Exam:  General: alert, appears stated age and no distress Lochia: appropriate Uterine Fundus: firm DVT Evaluation: No evidence of DVT seen on physical exam.  Discharge Diagnoses: Term Pregnancy-delivered   Prenatal labs:  O negative, Rhogam given for Rh positive baby Rubella Immune, Varicella Immune TDAP received prior to pregnancy Contraception: plans BTL during 6 week pp recovery   Discharge Information: Date: 12/25/2016 Activity: pelvic rest Diet: routine Allergies as of 12/25/2016      Reactions   Codeine Other (See Comments)   Reaction:  Unknown       Medication List    TAKE these medications   levothyroxine 137 MCG tablet Commonly known as:  SYNTHROID, LEVOTHROID Take 137 mcg by mouth daily before breakfast.   liothyronine 5 MCG tablet Commonly known as:  CYTOMEL Take 10 mcg by mouth 2 (two) times daily.   Prenatal Vitamins 0.8 MG tablet Take 1 tablet by mouth daily.       Condition: stable Discharge to: home Follow-up Information    Rondel Oh, MD Follow up in 4 week(s).   Specialty:  Obstetrics and Gynecology Why:  Postpartum visit and tubal ligation scheduling Contact information: [place] [address] [phone]           Newborn Data: Live born female Birth Weight: 4500 g APGAR: 47, 9 Breastfeeding  Home with mother.  Rod Can, CNM 12/25/2016, 10:34 AM

## 2016-12-24 LAB — CBC
HEMATOCRIT: 27.8 % — AB (ref 35.0–47.0)
HEMOGLOBIN: 9 g/dL — AB (ref 12.0–16.0)
MCH: 25.1 pg — ABNORMAL LOW (ref 26.0–34.0)
MCHC: 32.3 g/dL (ref 32.0–36.0)
MCV: 77.7 fL — AB (ref 80.0–100.0)
Platelets: 192 10*3/uL (ref 150–440)
RBC: 3.57 MIL/uL — AB (ref 3.80–5.20)
RDW: 16 % — ABNORMAL HIGH (ref 11.5–14.5)
WBC: 10.9 10*3/uL (ref 3.6–11.0)

## 2016-12-24 LAB — FETAL SCREEN: Fetal Screen: NEGATIVE

## 2016-12-24 LAB — RPR: RPR Ser Ql: NONREACTIVE

## 2016-12-24 LAB — SURGICAL PATHOLOGY

## 2016-12-24 NOTE — Progress Notes (Signed)
  Subjective:  Doing well no concerns, minimal lochia.  No fevers or chills.  Objective:   Blood pressure (!) 117/56, pulse 60, temperature 97.9 F (36.6 C), temperature source Oral, resp. rate 18, height 5\' 6"  (1.676 m), weight 252 lb (114.3 kg), SpO2 98 %, unknown if currently breastfeeding.  General: NAD Pulmonary: no increased work of breathing Abdomen: non-distended, non-tender, fundus firm at level of umbilicus Extremities: no edema, no erythema, no tenderness  Results for orders placed or performed during the hospital encounter of 12/23/16 (from the past 72 hour(s))  CBC on admission     Status: Abnormal   Collection Time: 12/23/16  2:46 AM  Result Value Ref Range   WBC 8.3 3.6 - 11.0 K/uL   RBC 4.07 3.80 - 5.20 MIL/uL   Hemoglobin 10.0 (L) 12.0 - 16.0 g/dL   HCT 31.0 (L) 35.0 - 47.0 %   MCV 76.1 (L) 80.0 - 100.0 fL   MCH 24.5 (L) 26.0 - 34.0 pg   MCHC 32.2 32.0 - 36.0 g/dL   RDW 15.7 (H) 11.5 - 14.5 %   Platelets 207 150 - 440 K/uL  RPR     Status: None   Collection Time: 12/23/16  2:46 AM  Result Value Ref Range   RPR Ser Ql Non Reactive Non Reactive    Comment: (NOTE) Performed At: [facility] [place], [place] [id number] Lindon Romp MD JT:[id number]   Type and screen [place]     Status: None   Collection Time: 12/23/16  2:46 AM  Result Value Ref Range   ABO/RH(D) O NEG    Antibody Screen NEG    Sample Expiration 12/26/2016   Fetal screen     Status: None   Collection Time: 12/24/16  4:54 AM  Result Value Ref Range   Fetal Screen NEG   Rhogam injection     Status: None (Preliminary result)   Collection Time: 12/24/16  4:54 AM  Result Value Ref Range   Unit Number [id number]    Blood Component Type RHIG    Unit division 00    Status of Unit ISSUED    Transfusion Status OK TO TRANSFUSE   CBC     Status: Abnormal   Collection Time: 12/24/16  4:54 AM  Result Value Ref Range   WBC 10.9 3.6 - 11.0 K/uL    RBC 3.57 (L) 3.80 - 5.20 MIL/uL   Hemoglobin 9.0 (L) 12.0 - 16.0 g/dL   HCT 27.8 (L) 35.0 - 47.0 %   MCV 77.7 (L) 80.0 - 100.0 fL   MCH 25.1 (L) 26.0 - 34.0 pg   MCHC 32.3 32.0 - 36.0 g/dL   RDW 16.0 (H) 11.5 - 14.5 %   Platelets 192 150 - 440 K/uL    Assessment:   36 y.o. [id number] postpartum day # 1 TSVD  Plan:    1) Acute blood loss anemia - hemodynamically stable and asymptomatic - po ferrous sulfate  2) Blood Type --/--/O NEG (03/08 0246) / Rubella  Immune / Varicella Immune  3) TDAP offer at discharge, influenza UTD received at work  4) Breast/Interval BTL  5) Disposition anticipate discharge PPD2

## 2016-12-25 LAB — RHOGAM INJECTION: Unit division: 0

## 2016-12-25 MED ORDER — PRENATAL VITAMINS 0.8 MG PO TABS
1.0000 | ORAL_TABLET | Freq: Every day | ORAL | 12 refills | Status: DC
Start: 2016-12-25 — End: 2018-07-05

## 2016-12-25 NOTE — Progress Notes (Signed)
D/C instructions provided, pt states understanding, aware of follow up appt. Pt states would like to leave after lunch.

## 2016-12-25 NOTE — Progress Notes (Signed)
D/C home to car via auxiliary in wheelchair.  

## 2017-01-10 ENCOUNTER — Telehealth: Payer: Self-pay

## 2017-01-10 NOTE — Telephone Encounter (Signed)
FMLA/DISABILITY form for [employer] filled out and given to [place] for processing.

## 2017-01-13 DIAGNOSIS — Z3A39 39 weeks gestation of pregnancy: Secondary | ICD-10-CM

## 2017-01-26 ENCOUNTER — Ambulatory Visit (INDEPENDENT_AMBULATORY_CARE_PROVIDER_SITE_OTHER): Payer: 59 | Admitting: Obstetrics and Gynecology

## 2017-01-26 VITALS — BP 132/80 | HR 62 | Ht 66.0 in | Wt 248.0 lb

## 2017-01-26 DIAGNOSIS — Z3009 Encounter for other general counseling and advice on contraception: Secondary | ICD-10-CM

## 2017-01-26 NOTE — Progress Notes (Signed)
Obstetrics & Gynecology Surgery H&P    Chief Complaint: Scheduled Surgery   History of Present Illness: Patient is a 36 y.o. [id number] presenting for scheduled laparoscopic BTL vis salpingectomy, for desired sterilization. Prior   Preoperative Pap:04/2016 Results: NIL HPV negative     Review of Systems:10 point review of systems  Past Medical History:  Past Medical History:  Diagnosis Date  . Anemia    pregnancy only  . Hypothyroidism    h/o thyroidectomy  . Macrosomia   . Malignant neoplasm of thyroid gland ([place])   . 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 COLPOSCOPY  07/2012   neg  . THYROIDECTOMY N/A 06/25/2015   Procedure: Total thyroidectomy with laryngeal nerve monitoring ;  Surgeon: Clyde Canterbury, MD;  Location: [facility] ORS;  Service: ENT;  Laterality: N/A;  . WISDOM TOOTH EXTRACTION      Family History:  No family history on file.  Social History:  Social History   Social History  . Marital status: Married    Spouse name: N/A  . Number of children: N/A  . Years of education: N/A   Occupational History  . Not on file.   Social History Main Topics  . Smoking status: Former Smoker    Types: Cigarettes    Quit date: 06/23/2008  . Smokeless tobacco: Never Used  . Alcohol use No     Comment: occ wine  . Drug use: No  . Sexual activity: Yes   Other Topics Concern  . Not on file   Social History Narrative  . No narrative on file    Allergies:  Allergies  Allergen Reactions  . Codeine Other (See Comments)    Reaction:  Unknown     Medications: Prior to Admission medications   Medication Sig Start Date End Date Taking? Authorizing Provider  levothyroxine (SYNTHROID, LEVOTHROID) 137 MCG tablet Take 137 mcg by mouth daily before breakfast.    Historical Provider, MD  liothyronine (CYTOMEL) 5 MCG tablet Take 10 mcg by mouth 2 (two) times daily.    Historical  Provider, MD  Prenatal Multivit-Min-Fe-FA (PRENATAL VITAMINS) 0.8 MG tablet Take 1 tablet by mouth daily. 12/25/16   Rod Can, CNM    Physical Exam Vitals: Blood pressure 132/80, pulse 62, height 5\' 6"  (1.676 m), weight 248 lb (112.5 kg), unknown if currently breastfeeding. General: NAD HEENT: normocephalic, anicteric Pulmonary: No increased work of breathing Cardiovascular: RRR, distal pulses 2+ Abdomen: soft, non-tender, non-distended, small umbilical hernia Genitourinary: deferred Extremities: no edema, erythema, or tenderness Neurologic: Grossly intact Psychiatric: mood appropriate, affect full  Imaging No results found.  Assessment: 36 y.o. [id number] presenting for scheduled laparoscopic BTL  Plan: 1) 36 y.o. [id number]  with undesired fertility, desires permanent sterilization.  Other reversible forms of contraception were discussed with patient; she declines all other modalities. Permanent nature of as well as associated risks of the procedure discussed with patient including but not limited to: risk of regret, permanence of method, bleeding, infection, injury to surrounding organs and need for additional procedures.  Failure risk of 0.5-1% with increased risk of ectopic gestation if pregnancy occurs was also discussed with patient.    2) Routine postoperative instructions were reviewed with the patient and her family in detail today including the expected length of recovery and likely postoperative course.  The patient concurred with the proposed plan, giving informed written consent for the surgery today.  Patient instructed on the importance of being NPO after midnight prior to her procedure.  If warranted preoperative prophylactic antibiotics and SCDs ordered on call to the OR to meet SCIP guidelines and adhere to recommendation laid forth in [place] Number 104 May 2009  "Antibiotic Prophylaxis for Gynecologic Procedures".

## 2017-01-26 NOTE — Patient Instructions (Signed)
Laparoscopic Tubal Ligation Laparoscopic tubal ligation is a procedure to close the fallopian tubes. This is done so that you cannot get pregnant. When the fallopian tubes are closed, the eggs that your ovaries release cannot enter the uterus, and sperm cannot reach the released eggs. A laparoscopic tubal ligation is sometimes called "getting your tubes tied." You should not have this procedure if you want to get pregnant someday or if you are unsure about having more children. Tell a health care provider about:  Any allergies you have.  All medicines you are taking, including vitamins, herbs, eye drops, creams, and over-the-counter medicines.  Any problems you or family members have had with anesthetic medicines.  Any blood disorders you have.  Any surgeries you have had.  Any medical conditions you have.  Whether you are pregnant or may be pregnant.  Any past pregnancies. What are the risks? Generally, this is a safe procedure. However, problems may occur, including:  Infection.  Bleeding.  Injury to surrounding organs.  Side effects from anesthetics.  Failure of the procedure. This procedure can increase your risk of a kind of pregnancy in which a fertilized egg attaches to the outside of the uterus (ectopic pregnancy). What happens before the procedure?  Ask your health care provider about:  Changing or stopping your regular medicines. This is especially important if you are taking diabetes medicines or blood thinners.  Taking medicines such as aspirin and ibuprofen. These medicines can thin your blood. Do not take these medicines before your procedure if your health care provider instructs you not to.  Follow instructions from your health care provider about eating and drinking restrictions.  Plan to have someone take you home after the procedure.  If you go home right after the procedure, plan to have someone with you for 24 hours. What happens during the  procedure?  You will be given one or more of the following:  A medicine to help you relax (sedative).  A medicine to numb the area (local anesthetic).  A medicine to make you fall asleep (general anesthetic).  A medicine that is injected into an area of your body to numb everything below the injection site (regional anesthetic).  An IV tube will be inserted into one of your veins. It will be used to give you medicines and fluids during the procedure.  Your bladder may be emptied with a small tube (catheter).  If you have been given a general anesthetic, a tube will be put down your throat to help you breathe.  Two small cuts (incisions) will be made in your lower abdomen and near your belly button.  Your abdomen will be inflated with a gas. This will let the surgeon see better and will give the surgeon room to work.  A thin, lighted tube (laparoscope) with a camera attached will be inserted into your abdomen through one of the incisions. Small instruments will be inserted through the other incision.  The fallopian tubes will be tied off, burned (cauterized), or blocked with a clip, ring, or clamp. A small portion in the center of each fallopian tube may be removed.  The gas will be released from the abdomen.  The incisions will be closed with stitches (sutures).  A bandage (dressing) will be placed over the incisions. The procedure may vary among health care providers and hospitals. What happens after the procedure?  Your blood pressure, heart rate, breathing rate, and blood oxygen level will be monitored often until the medicines you were  given have worn off.  You will be given medicine to help with pain, nausea, and vomiting as needed. This information is not intended to replace advice given to you by your health care provider. Make sure you discuss any questions you have with your health care provider. Document Released: 01/10/2001 Document Revised: 03/11/2016 Document  Reviewed: 09/14/2015 Elsevier Interactive Patient Education  2017 [employer].

## 2017-01-31 ENCOUNTER — Encounter
Admission: RE | Admit: 2017-01-31 | Discharge: 2017-01-31 | Disposition: A | Discharge status: Home / Self Care | Payer: 59 | Source: Ambulatory Visit | Attending: Obstetrics and Gynecology | Admitting: Obstetrics and Gynecology

## 2017-01-31 HISTORY — DX: Other specified postprocedural states: Z98.890

## 2017-01-31 HISTORY — DX: Nausea with vomiting, unspecified: R11.2

## 2017-01-31 HISTORY — DX: Other complications of anesthesia, initial encounter: T88.59XA

## 2017-01-31 HISTORY — DX: Adverse effect of unspecified anesthetic, initial encounter: T41.45XA

## 2017-01-31 NOTE — Patient Instructions (Signed)
  Your procedure is scheduled on: 02-03-17 (Thursday) Report to Same Day Surgery 2nd floor medical mall [facility] Entrance-take elevator on left to 2nd floor.  Check in with surgery information desk.) To find out your arrival time please call [phone] between 1PM - 3PM on 02-02-17 (Wednesday)  Remember: Instructions that are not followed completely may result in serious medical risk, up to and including death, or upon the discretion of your surgeon and anesthesiologist your surgery may need to be rescheduled.    _x___ 1. Do not eat food or drink liquids after midnight. No gum chewing or hard candies.     __x__ 2. No Alcohol for 24 hours before or after surgery.   __x__3. No Smoking for 24 prior to surgery.   ____  4. Bring all medications with you on the day of surgery if instructed.    __x__ 5. Notify your doctor if there is any change in your medical condition     (cold, fever, infections).     Do not wear jewelry, make-up, hairpins, clips or nail polish.  Do not wear lotions, powders, or perfumes. You may wear deodorant.  Do not shave 48 hours prior to surgery. Men may shave face and neck.  Do not bring valuables to the hospital.    [facility] is not responsible for any belongings or valuables.               Contacts, dentures or bridgework may not be worn into surgery.  Leave your suitcase in the car. After surgery it may be brought to your room.  For patients admitted to the hospital, discharge time is determined by your treatment team.   Patients discharged the day of surgery will not be allowed to drive home.  You will need someone to drive you home and stay with you the night of your procedure.    Please read over the following fact sheets that you were given:      _x___ Take anti-hypertensive (unless it includes a diuretic), cardiac, seizure, asthma,     anti-reflux and psychiatric medicines. These include:  1. LEVOTHYROXINE (SYNTHROID)  2. LIOTHYRONINE  (CYTOMEL)  3.  4.  5.  6.  ____Fleets enema or Magnesium Citrate as directed.   _x___ Use CHG Soap or sage wipes as directed on instruction sheet   ____ Use inhalers on the day of surgery and bring to hospital day of surgery  ____ Stop Metformin and Janumet 2 days prior to surgery.    ____ Take 1/2 of usual insulin dose the night before surgery and none on the morning surgery.   ____ Follow recommendations from Cardiologist, Pulmonologist or PCP regarding stopping Aspirin, Coumadin, Pllavix ,Eliquis, Effient, or Pradaxa, and Pletal.  X____Stop Anti-inflammatories such as Advil, Aleve, Ibuprofen, Motrin, Naproxen, Naprosyn, Goodies powders or aspirin products NOW-OK to take Tylenol   ____ Stop supplements until after surgery.     ____ Bring C-Pap to the hospital.

## 2017-02-01 ENCOUNTER — Other Ambulatory Visit: Payer: Self-pay | Admitting: Obstetrics and Gynecology

## 2017-02-01 ENCOUNTER — Telehealth: Payer: Self-pay

## 2017-02-01 MED ORDER — NYSTATIN 100000 UNIT/GM EX CREA: 1.0000 "application " | TOPICAL_CREAM | Freq: Three times a day (TID) | CUTANEOUS | 1 refills | Status: DC

## 2017-02-01 NOTE — Telephone Encounter (Signed)
Pt delivered 12/23/16 and currently breastfeeding. Pt c/o daughter having thrush in her mouth that has now spread to her breast and requests rx for diflucan be sent in. cb# [phone] thank you.

## 2017-02-01 NOTE — Progress Notes (Signed)
Infant with Trush nystatin to apply to nipples rx'ed

## 2017-02-02 ENCOUNTER — Encounter
Admission: RE | Admit: 2017-02-02 | Discharge: 2017-02-02 | Disposition: A | Discharge status: Home / Self Care | Payer: 59 | Source: Ambulatory Visit | Attending: Obstetrics and Gynecology | Admitting: Obstetrics and Gynecology

## 2017-02-02 DIAGNOSIS — Z87891 Personal history of nicotine dependence: Secondary | ICD-10-CM | POA: Diagnosis not present

## 2017-02-02 DIAGNOSIS — Z85850 Personal history of malignant neoplasm of thyroid: Secondary | ICD-10-CM | POA: Diagnosis not present

## 2017-02-02 DIAGNOSIS — Z302 Encounter for sterilization: Secondary | ICD-10-CM | POA: Diagnosis not present

## 2017-02-02 DIAGNOSIS — Z79899 Other long term (current) drug therapy: Secondary | ICD-10-CM | POA: Diagnosis not present

## 2017-02-02 DIAGNOSIS — E890 Postprocedural hypothyroidism: Secondary | ICD-10-CM | POA: Diagnosis not present

## 2017-02-02 LAB — HEMOGLOBIN: HEMOGLOBIN: 12.1 g/dL (ref 12.0–16.0)

## 2017-02-03 ENCOUNTER — Encounter: Payer: Self-pay | Admitting: *Deleted

## 2017-02-03 ENCOUNTER — Ambulatory Visit: Payer: 59 | Admitting: Anesthesiology

## 2017-02-03 ENCOUNTER — Encounter
Admission: RE | Disposition: A | Discharge status: Home / Self Care | Payer: Self-pay | Source: Ambulatory Visit | Attending: Obstetrics and Gynecology

## 2017-02-03 ENCOUNTER — Ambulatory Visit
Admission: RE | Admit: 2017-02-03 | Discharge: 2017-02-03 | Disposition: A | Discharge status: Home / Self Care | Payer: 59 | Source: Ambulatory Visit | Attending: Obstetrics and Gynecology | Admitting: Obstetrics and Gynecology

## 2017-02-03 DIAGNOSIS — Z302 Encounter for sterilization: Secondary | ICD-10-CM | POA: Diagnosis not present

## 2017-02-03 DIAGNOSIS — E890 Postprocedural hypothyroidism: Secondary | ICD-10-CM | POA: Diagnosis not present

## 2017-02-03 DIAGNOSIS — Z87891 Personal history of nicotine dependence: Secondary | ICD-10-CM | POA: Insufficient documentation

## 2017-02-03 DIAGNOSIS — Z79899 Other long term (current) drug therapy: Secondary | ICD-10-CM | POA: Diagnosis not present

## 2017-02-03 DIAGNOSIS — Z85850 Personal history of malignant neoplasm of thyroid: Secondary | ICD-10-CM | POA: Diagnosis not present

## 2017-02-03 DIAGNOSIS — Z9889 Other specified postprocedural states: Secondary | ICD-10-CM

## 2017-02-03 HISTORY — PX: TUBAL LIGATION: SHX77

## 2017-02-03 HISTORY — PX: LAPAROSCOPIC TUBAL LIGATION: SHX1937

## 2017-02-03 LAB — POCT PREGNANCY, URINE: Preg Test, Ur: NEGATIVE

## 2017-02-03 SURGERY — LIGATION, FALLOPIAN TUBE, LAPAROSCOPIC
Anesthesia: General

## 2017-02-03 MED ORDER — EPHEDRINE SULFATE 50 MG/ML IJ SOLN
INTRAMUSCULAR | Status: DC | PRN
  Administered 2017-02-03: 10 mg via INTRAVENOUS

## 2017-02-03 MED ORDER — FENTANYL CITRATE (PF) 100 MCG/2ML IJ SOLN
INTRAMUSCULAR | Status: AC
  Administered 2017-02-03: 25 ug via INTRAVENOUS
  Filled 2017-02-03: qty 2

## 2017-02-03 MED ORDER — FENTANYL CITRATE (PF) 100 MCG/2ML IJ SOLN
INTRAMUSCULAR | Status: AC
Start: 2017-02-03 — End: 2017-02-03
  Filled 2017-02-03: qty 2

## 2017-02-03 MED ORDER — ONDANSETRON HCL 4 MG/2ML IJ SOLN
INTRAMUSCULAR | Status: DC | PRN
  Administered 2017-02-03 (×2): 4 mg via INTRAVENOUS

## 2017-02-03 MED ORDER — MIDAZOLAM HCL 2 MG/2ML IJ SOLN
INTRAMUSCULAR | Status: AC
  Filled 2017-02-03: qty 2

## 2017-02-03 MED ORDER — ONDANSETRON HCL 4 MG/2ML IJ SOLN: 4.0000 mg | Freq: Once | INTRAMUSCULAR | Status: DC | PRN

## 2017-02-03 MED ORDER — GLYCOPYRROLATE 0.2 MG/ML IJ SOLN
INTRAMUSCULAR | Status: DC | PRN
  Administered 2017-02-03: 0.2 mg via INTRAVENOUS

## 2017-02-03 MED ORDER — SUGAMMADEX SODIUM 500 MG/5ML IV SOLN
INTRAVENOUS | Status: AC
  Filled 2017-02-03: qty 5

## 2017-02-03 MED ORDER — LIDOCAINE HCL (CARDIAC) 20 MG/ML IV SOLN
INTRAVENOUS | Status: DC | PRN
  Administered 2017-02-03: 80 mg via INTRAVENOUS

## 2017-02-03 MED ORDER — FENTANYL CITRATE (PF) 100 MCG/2ML IJ SOLN
INTRAMUSCULAR | Status: DC | PRN
  Administered 2017-02-03 (×2): 100 ug via INTRAVENOUS

## 2017-02-03 MED ORDER — GLYCOPYRROLATE 0.2 MG/ML IJ SOLN
INTRAMUSCULAR | Status: AC
  Filled 2017-02-03: qty 1

## 2017-02-03 MED ORDER — LACTATED RINGERS IV SOLN
INTRAVENOUS | Status: DC
  Administered 2017-02-03 (×2): via INTRAVENOUS

## 2017-02-03 MED ORDER — SUGAMMADEX SODIUM 500 MG/5ML IV SOLN
INTRAVENOUS | Status: DC | PRN
  Administered 2017-02-03: 225 mg via INTRAVENOUS

## 2017-02-03 MED ORDER — IBUPROFEN 200 MG PO TABS: 400.0000 mg | ORAL_TABLET | Freq: Every day | ORAL | 0 refills | Status: DC | PRN

## 2017-02-03 MED ORDER — ACETAMINOPHEN 10 MG/ML IV SOLN
INTRAVENOUS | Status: DC | PRN
  Administered 2017-02-03: 1000 mg via INTRAVENOUS

## 2017-02-03 MED ORDER — ACETAMINOPHEN NICU IV SYRINGE 10 MG/ML
INTRAVENOUS | Status: AC
  Filled 2017-02-03: qty 1

## 2017-02-03 MED ORDER — PROPOFOL 10 MG/ML IV BOLUS
INTRAVENOUS | Status: AC
  Filled 2017-02-03: qty 20

## 2017-02-03 MED ORDER — FAMOTIDINE 20 MG PO TABS
ORAL_TABLET | ORAL | Status: AC
  Filled 2017-02-03: qty 1

## 2017-02-03 MED ORDER — OXYCODONE-ACETAMINOPHEN 5-325 MG PO TABS: 1.0000 | ORAL_TABLET | Freq: Four times a day (QID) | ORAL | 0 refills | Status: DC | PRN

## 2017-02-03 MED ORDER — FENTANYL CITRATE (PF) 100 MCG/2ML IJ SOLN
INTRAMUSCULAR | Status: AC
  Filled 2017-02-03: qty 2

## 2017-02-03 MED ORDER — ROCURONIUM BROMIDE 50 MG/5ML IV SOLN
INTRAVENOUS | Status: AC
  Filled 2017-02-03: qty 1

## 2017-02-03 MED ORDER — PROPOFOL 10 MG/ML IV BOLUS
INTRAVENOUS | Status: DC | PRN
  Administered 2017-02-03: 200 mg via INTRAVENOUS

## 2017-02-03 MED ORDER — ROCURONIUM BROMIDE 100 MG/10ML IV SOLN
INTRAVENOUS | Status: DC | PRN
  Administered 2017-02-03 (×2): 20 mg via INTRAVENOUS
  Administered 2017-02-03: 30 mg via INTRAVENOUS

## 2017-02-03 MED ORDER — FAMOTIDINE 20 MG PO TABS
20.0000 mg | ORAL_TABLET | Freq: Once | ORAL | Status: AC
  Administered 2017-02-03: 20 mg via ORAL

## 2017-02-03 MED ORDER — BUPIVACAINE HCL 0.5 % IJ SOLN
INTRAMUSCULAR | Status: DC | PRN
  Administered 2017-02-03: 17 mL

## 2017-02-03 MED ORDER — LIDOCAINE HCL (PF) 2 % IJ SOLN
INTRAMUSCULAR | Status: AC
  Filled 2017-02-03: qty 2

## 2017-02-03 MED ORDER — BUPIVACAINE HCL (PF) 0.5 % IJ SOLN
INTRAMUSCULAR | Status: AC
  Filled 2017-02-03: qty 30

## 2017-02-03 MED ORDER — MIDAZOLAM HCL 2 MG/2ML IJ SOLN
INTRAMUSCULAR | Status: DC | PRN
  Administered 2017-02-03: 2 mg via INTRAVENOUS

## 2017-02-03 MED ORDER — FENTANYL CITRATE (PF) 100 MCG/2ML IJ SOLN
25.0000 ug | INTRAMUSCULAR | Status: AC | PRN
  Administered 2017-02-03 (×6): 25 ug via INTRAVENOUS

## 2017-02-03 MED ORDER — DEXAMETHASONE SODIUM PHOSPHATE 10 MG/ML IJ SOLN
INTRAMUSCULAR | Status: DC | PRN
Start: 2017-02-03 — End: 2017-02-03
  Administered 2017-02-03: 10 mg via INTRAVENOUS

## 2017-02-03 SURGICAL SUPPLY — 30 items
BLADE SURG SZ11 CARB STEEL (BLADE) ×3 IMPLANT
CATH ROBINSON RED A/P 16FR (CATHETERS) ×3 IMPLANT
CHLORAPREP W/TINT 26ML (MISCELLANEOUS) ×3 IMPLANT
DERMABOND ADVANCED (GAUZE/BANDAGES/DRESSINGS) ×2
DERMABOND ADVANCED .7 DNX12 (GAUZE/BANDAGES/DRESSINGS) ×1 IMPLANT
DRSG TEGADERM 2-3/8X2-3/4 SM (GAUZE/BANDAGES/DRESSINGS) ×3 IMPLANT
DRSG TELFA 3X8 NADH (GAUZE/BANDAGES/DRESSINGS) ×3 IMPLANT
GLOVE BIO SURGEON STRL SZ 6.5 (GLOVE) ×2 IMPLANT
GLOVE BIO SURGEON STRL SZ7 (GLOVE) ×3 IMPLANT
GLOVE BIO SURGEONS STRL SZ 6.5 (GLOVE) ×1
GLOVE BIOGEL PI IND STRL 6.5 (GLOVE) ×1 IMPLANT
GLOVE BIOGEL PI INDICATOR 6.5 (GLOVE) ×2
GLOVE INDICATOR 7.0 STRL GRN (GLOVE) ×3 IMPLANT
GLOVE INDICATOR 7.5 STRL GRN (GLOVE) ×3 IMPLANT
GOWN STRL REUS W/ TWL LRG LVL3 (GOWN DISPOSABLE) ×2 IMPLANT
GOWN STRL REUS W/TWL LRG LVL3 (GOWN DISPOSABLE) ×4
KIT RM TURNOVER CYSTO AR (KITS) ×3 IMPLANT
LABEL OR SOLS (LABEL) ×3 IMPLANT
NS IRRIG 500ML POUR BTL (IV SOLUTION) ×3 IMPLANT
PACK GYN LAPAROSCOPIC (MISCELLANEOUS) ×3 IMPLANT
PAD OB MATERNITY 4.3X12.25 (PERSONAL CARE ITEMS) ×3 IMPLANT
PAD PREP 24X41 OB/GYN DISP (PERSONAL CARE ITEMS) ×3 IMPLANT
PENCIL ELECTRO HAND CTR (MISCELLANEOUS) ×3 IMPLANT
SHEARS HARMONIC ACE PLUS 36CM (ENDOMECHANICALS) ×3 IMPLANT
SLEEVE ENDOPATH XCEL 5M (ENDOMECHANICALS) ×3 IMPLANT
SUT MNCRL AB 4-0 PS2 18 (SUTURE) ×3 IMPLANT
SUT VIC AB 2-0 SH 27 (SUTURE) ×2
SUT VIC AB 2-0 SH 27XBRD (SUTURE) ×1 IMPLANT
TROCAR XCEL NON-BLD 5MMX100MML (ENDOMECHANICALS) ×3 IMPLANT
TUBING INSUFFLATOR HI FLOW (MISCELLANEOUS) ×3 IMPLANT

## 2017-02-03 NOTE — OR Nursing (Signed)
Anesthesia aware of earring in tragus that pt is unable to remove- earring taped.

## 2017-02-03 NOTE — Discharge Instructions (Signed)
AMBULATORY SURGERY  DISCHARGE INSTRUCTIONS   1) The drugs that you were given will stay in your system until tomorrow so for the next 24 hours you should not:  A) Drive an automobile B) Make any legal decisions C) Drink any alcoholic beverage   2) You may resume regular meals tomorrow.  Today it is better to start with liquids and gradually work up to solid foods.  You may eat anything you prefer, but it is better to start with liquids, then soup and crackers, and gradually work up to solid foods.   3) Please notify your doctor immediately if you have any unusual bleeding, trouble breathing, redness and pain at the surgery site, drainage, fever, or pain not relieved by medication.    4) Additional Instructions:  Splint abdomen when coughing, sneezing and laughing. Take stool softeners if and when you take pain medication.  Have something in your stomach prior to taking any narcotics.Drink lots of fluids.    Please contact your physician with any problems or Same Day Surgery at [phone], Monday through Friday 6 am to 4 pm, or [place] at [facility] number at [phone].

## 2017-02-03 NOTE — Transfer of Care (Signed)
Immediate Anesthesia Transfer of Care Note  Patient: Brooke Gordon  Procedure(s) Performed: Procedure(s): LAPAROSCOPIC TUBAL LIGATION via SALPINGECTOMY (N/A)  Patient Location: PACU  Anesthesia Type:General  Level of Consciousness: awake, alert  and oriented  Airway & Oxygen Therapy: Patient connected to face mask oxygen  Post-op Assessment: Post -op Vital signs reviewed and stable  Post vital signs: stable  Last Vitals:  Vitals:   02/03/17 0644 02/03/17 0910  BP: (!) 154/91 133/77  Pulse:  94  Resp:    Temp:  36.1 C    Last Pain:  Vitals:   02/03/17 0910  TempSrc: Temporal         Complications: No apparent anesthesia complications

## 2017-02-03 NOTE — Op Note (Signed)
Preoperative Diagnosis: 1) 36 y.o. with undesired fertility 2) Possible umbilical hernia  Postoperative Diagnosis: 1) 36 y.o. with undesired fertility  Operation Performed: Laparoscopic bilateral tubal ligation via salpingectomy  Indication: 36 y.o. [id number]  with undesired fertility, desires permanent sterilization.  Other reversible forms of contraception were discussed with patient; she declines all other modalities. Permanent nature of as well as associated risks of the procedure discussed with patient including but not limited to: risk of regret, permanence of method, bleeding, infection, injury to surrounding organs and need for additional procedures.  Failure risk of 0.5-1% with increased risk of ectopic gestation if pregnancy occurs was also discussed with patient.    Surgeon: Malachy Mood, MD  Anesthesia: General  Preoperative Antibiotics: none  Estimated Blood Loss: 32mL  IV Fluids: 871mL crystaloid  Urine Output:: 348mL  Drains or Tubes: none  Implants: none  Specimens Removed: none  Complications: none  Intraoperative Findings: Normal tubes, ovaries, and uterus.  No evidence of fascial defect or umbilical hernia  Patient Condition: stable  Procedure in Detail:  Patient was taken to the operating room where she was administered general anesthesia.  She was positioned in the dorsal lithotomy position utilizing Allen stirups, prepped and draped in the usual sterile fashion.  Prior to proceeding with procedure a time out was performed.  Attention was turned to the patient's pelvis.  A red rubber catheter was used to empty the patient's bladder.  An operative speculum was placed to allow visualization of the cervix.  The anterior lip of the cervix was grasped with a single tooth tenaculum, and a Hulka tenaculum was placed to allow manipulation of the uterus.  The operative speculum and single tooth tenaculum were then removed.  Attention was turned to the patient's  abdomen.  Palmer's point was infiltrated with 1% Sensorcaine, before making a stab incision using an 11 blade scalpel.  A 38mm Excel trocar was then used to gain direct entry into the peritoneal cavity utilizing the camera to visualize progress of the trocar during placement.  Once peritoneal entry had been achieved, insufflation was started and pneumoperitoneum established at a pressure of 32mmHg.   General inspection of the abdomen revealed the above noted findings.  A vertical infraumbilical incision was made, the area of patient concern for umbilical hernia was explored after clearing subcutaneous fat off the fascia using a hemostat.  No fascial defect or weakening was appreciated.  There was some subcutaneous fat which was actually responsible for the asymmetry noted by the patient that was excised. A 16mm hassan port was placed through the umbilicus, follwed by a left lower quadrant 14mm excel trocar.   The right tube was identified and grasped at it's fimbriated end, the tube was excised from its attachments to the ovary, mesosalpinx, and uterus using a 60mm harmonic.  The same procedure was repeated on the patient's left fallopian tube.  Pedicles were hemostatic.   Pneumoperitoneum was evacuated.  The trocars were removed.  The 11mm trocar site was closed using 0 Maxon for the fascia with 4-0 Monocryl in a subcuticular fashion.  All trocar sites were then dressed with surgical skin glue.  The Hulka tenaculum was removed.  Sponge needle and instrument counts were correct time two.  The patient tolerated the procedure well and was taken to the recovery room in stable condition.

## 2017-02-03 NOTE — Anesthesia Procedure Notes (Signed)
Procedure Name: Intubation Date/Time: 02/03/2017 7:44 AM Performed by: Aline Brochure Pre-anesthesia Checklist: Patient identified, Emergency Drugs available, Suction available and Patient being monitored Patient Re-evaluated:Patient Re-evaluated prior to inductionOxygen Delivery Method: Circle system utilized Preoxygenation: Pre-oxygenation with 100% oxygen Intubation Type: IV induction Ventilation: Mask ventilation without difficulty Laryngoscope Size: Mac and 3 Grade View: Grade III Tube type: Oral Tube size: 7.0 mm Number of attempts: 3 Airway Equipment and Method: Bougie stylet Placement Confirmation: positive ETCO2 and breath sounds checked- equal and bilateral Secured at: 20 cm Tube secured with: Tape Dental Injury: Teeth and Oropharynx as per pre-operative assessment  Difficulty Due To: Difficult Airway- due to anterior larynx

## 2017-02-03 NOTE — Anesthesia Preprocedure Evaluation (Signed)
Anesthesia Evaluation  Patient identified by MRN, date of birth, ID band Patient awake    Reviewed: Allergy & Precautions, NPO status , Patient's Chart, lab work & pertinent test results  History of Anesthesia Complications (+) PONV  Airway Mallampati: II       Dental   Pulmonary neg pulmonary ROS, former smoker,           Cardiovascular negative cardio ROS       Neuro/Psych negative neurological ROS     GI/Hepatic negative GI ROS, Neg liver ROS,   Endo/Other  Hypothyroidism   Renal/GU negative Renal ROS     Musculoskeletal   Abdominal   Peds  Hematology  (+) anemia ,   Anesthesia Other Findings   Reproductive/Obstetrics                             Anesthesia Physical Anesthesia Plan  ASA: II  Anesthesia Plan: General   Post-op Pain Management:    Induction:   Airway Management Planned: Oral ETT  Additional Equipment:   Intra-op Plan:   Post-operative Plan:   Informed Consent: I have reviewed the patients History and Physical, chart, labs and discussed the procedure including the risks, benefits and alternatives for the proposed anesthesia with the patient or authorized representative who has indicated his/her understanding and acceptance.     Plan Discussed with:   Anesthesia Plan Comments:         Anesthesia Quick Evaluation

## 2017-02-03 NOTE — H&P (Signed)
Date of Initial H&P: 02/01/18 History reviewed, patient examined, no change in status, stable for surgery.

## 2017-02-03 NOTE — Anesthesia Postprocedure Evaluation (Signed)
Anesthesia Post Note  Patient: HAYA HEMLER  Procedure(s) Performed: Procedure(s) (LRB): LAPAROSCOPIC TUBAL LIGATION via SALPINGECTOMY (N/A)  Patient location during evaluation: PACU Anesthesia Type: General Level of consciousness: awake and alert Pain management: pain level controlled Vital Signs Assessment: post-procedure vital signs reviewed and stable Respiratory status: spontaneous breathing and respiratory function stable Cardiovascular status: stable Anesthetic complications: no     Last Vitals:  Vitals:   02/03/17 0925 02/03/17 0940  BP: 131/72 125/75  Pulse: (!) 53 (!) 53  Resp:    Temp:      Last Pain:  Vitals:   02/03/17 0940  TempSrc:   PainSc: 5                  KEPHART,WILLIAM K

## 2017-02-03 NOTE — Anesthesia Post-op Follow-up Note (Cosign Needed)
Anesthesia QCDR form completed.        

## 2017-02-03 NOTE — Telephone Encounter (Signed)
Dr. Georgianne Fick prescribed nystatin 02/01/17.

## 2017-02-04 ENCOUNTER — Encounter: Payer: Self-pay | Admitting: Obstetrics and Gynecology

## 2017-02-04 LAB — SURGICAL PATHOLOGY

## 2017-02-07 ENCOUNTER — Encounter: Payer: Self-pay | Admitting: Obstetrics and Gynecology

## 2017-02-09 ENCOUNTER — Ambulatory Visit (INDEPENDENT_AMBULATORY_CARE_PROVIDER_SITE_OTHER): Payer: 59 | Admitting: Obstetrics and Gynecology

## 2017-02-09 ENCOUNTER — Encounter: Payer: Self-pay | Admitting: Obstetrics and Gynecology

## 2017-02-09 VITALS — BP 124/80 | HR 76 | Wt 243.0 lb

## 2017-02-09 DIAGNOSIS — Z4889 Encounter for other specified surgical aftercare: Secondary | ICD-10-CM

## 2017-02-09 NOTE — Progress Notes (Signed)
      Postoperative Follow-up Patient presents post op from laparoscopic bilateral salpingectomy 1weeks ago for sterilization.  Subjective: Patient reports marked improvement in her preop symptoms. Eating a regular diet without difficulty. Pain is controlled without any medications.  Activity: normal activities of daily living.  Still some discomfort from fascial stitch at umbilicus  Objective: Vitals:   02/09/17 1058  BP: 124/80  Pulse: 76   Gen: NAD Abdomen: soft, appropriately tender, non-distended, trocar sites D/C/I some mild echymosis   Assessment: 36 y.o. s/p laparoscopic bilateral salpingectomy stable  Plan: Patient has done well after surgery with no apparent complications.  I have discussed the post-operative course to date, and the expected progress moving forward.  The patient understands what complications to be concerned about.  I will see the patient in routine follow up, or sooner if needed.    Activity plan: No heavy lifting.  May return to work in 6 weeks.  Given that she had a salpingectomy and slightly larger umbilical incision for removal of the tubes heavy lifting does increase her risk for incision hernia formation.  Malachy Mood 02/09/2017, 11:27 AM

## 2017-02-15 ENCOUNTER — Telehealth: Payer: Self-pay

## 2017-02-15 NOTE — Telephone Encounter (Signed)
FMLA/DISABILITY form for [employer] filled out and given to [place] for processing.

## 2017-02-16 DIAGNOSIS — E890 Postprocedural hypothyroidism: Secondary | ICD-10-CM | POA: Diagnosis not present

## 2017-03-01 DIAGNOSIS — E890 Postprocedural hypothyroidism: Secondary | ICD-10-CM | POA: Diagnosis not present

## 2017-03-01 DIAGNOSIS — Z85850 Personal history of malignant neoplasm of thyroid: Secondary | ICD-10-CM | POA: Diagnosis not present

## 2017-03-04 ENCOUNTER — Encounter: Payer: Self-pay | Admitting: Obstetrics and Gynecology

## 2017-03-18 ENCOUNTER — Encounter: Payer: Self-pay | Admitting: Obstetrics and Gynecology

## 2017-03-18 ENCOUNTER — Ambulatory Visit (INDEPENDENT_AMBULATORY_CARE_PROVIDER_SITE_OTHER): Payer: 59 | Admitting: Obstetrics and Gynecology

## 2017-03-18 VITALS — BP 138/82 | HR 56 | Wt 241.0 lb

## 2017-03-18 DIAGNOSIS — Z4889 Encounter for other specified surgical aftercare: Secondary | ICD-10-CM

## 2017-03-20 NOTE — Progress Notes (Signed)
      Postoperative Follow-up Patient presents post op from laparoscopic bilateral salpingectomy 2weeks ago for requested sterilization.  Subjective: Patient reports marked improvement in her preop symptoms. Eating a regular diet without difficulty. The patient is not having any pain.  Activity: normal activities of daily living.  Objective: Vitals:   03/18/17 1014  BP: 138/82  Pulse: (!) 56   Gen: NAD Abdomen: soft, non-tender, non-distended, incisions D/C/I healing well  Assessment: 36 y.o. s/p laparoscopic bilateral salpingectomy stable  Plan: Patient has done well after surgery with no apparent complications.  I have discussed the post-operative course to date, and the expected progress moving forward.  The patient understands what complications to be concerned about.  I will see the patient in routine follow up, or sooner if needed.    Activity plan: No restriction.  Malachy Mood 03/20/2017, 8:43 PM

## 2017-04-26 DIAGNOSIS — E890 Postprocedural hypothyroidism: Secondary | ICD-10-CM | POA: Diagnosis not present

## 2017-05-03 DIAGNOSIS — E890 Postprocedural hypothyroidism: Secondary | ICD-10-CM | POA: Diagnosis not present

## 2017-05-03 DIAGNOSIS — Z85850 Personal history of malignant neoplasm of thyroid: Secondary | ICD-10-CM | POA: Diagnosis not present

## 2017-06-21 ENCOUNTER — Encounter: Payer: Self-pay | Admitting: Family Medicine

## 2017-10-06 ENCOUNTER — Telehealth: Payer: Self-pay

## 2017-10-06 NOTE — Telephone Encounter (Signed)
Sees [employer], PA-Anastasiya Estell Harpin, RMA

## 2017-10-06 NOTE — Telephone Encounter (Signed)
Left patient a voicemail to call back to schedule CPE. She has not been seen since 07/12/2016.

## 2018-05-16 ENCOUNTER — Encounter: Payer: 59 | Admitting: Family Medicine

## 2018-07-05 ENCOUNTER — Ambulatory Visit (INDEPENDENT_AMBULATORY_CARE_PROVIDER_SITE_OTHER): Payer: 59 | Admitting: Family Medicine

## 2018-07-05 ENCOUNTER — Encounter: Payer: Self-pay | Admitting: Family Medicine

## 2018-07-05 VITALS — BP 120/88 | HR 73 | Temp 97.9°F | Ht 66.0 in | Wt 217.6 lb

## 2018-07-05 DIAGNOSIS — E890 Postprocedural hypothyroidism: Secondary | ICD-10-CM | POA: Diagnosis not present

## 2018-07-05 DIAGNOSIS — E669 Obesity, unspecified: Secondary | ICD-10-CM | POA: Diagnosis not present

## 2018-07-05 DIAGNOSIS — E559 Vitamin D deficiency, unspecified: Secondary | ICD-10-CM

## 2018-07-05 DIAGNOSIS — Z0000 Encounter for general adult medical examination without abnormal findings: Secondary | ICD-10-CM

## 2018-07-05 DIAGNOSIS — Z6835 Body mass index (BMI) 35.0-35.9, adult: Secondary | ICD-10-CM

## 2018-07-05 DIAGNOSIS — K760 Fatty (change of) liver, not elsewhere classified: Secondary | ICD-10-CM | POA: Diagnosis not present

## 2018-07-05 DIAGNOSIS — E66812 Obesity, class 2: Secondary | ICD-10-CM

## 2018-07-05 NOTE — Progress Notes (Signed)
Patient: Brooke Gordon, Female    DOB: [birth year], 37 y.o.   MRN: [id number] Visit Date: 07/05/2018  Today's Provider: Lavon Paganini, MD   Chief Complaint  Patient presents with  . Annual Exam   Subjective:  I, Tiburcio Pea, CMA, am acting as a scribe for Lavon Paganini, MD.    Annual physical exam Brooke Gordon is a 37 y.o. female who presents today for health maintenance and complete physical. She feels well. She reports exercising none. She reports she is sleeping well.  Patient has a history of papillary thyroid cancer.  She is status post total thyroidectomy and radioablation.  She is followed by endocrinology.  At her last ultrasound, she had no evidence of thyroid remnant.  Her hypothyroidism is also being managed with Synthroid that is monitored by endocrinology  She has a history of endometrial cells on Pap smear, but normal colposcopy and repeat Pap smears.  Last Pap smear in 2017 -----------------------------------------------------------------   Review of Systems  Constitutional: Negative.   HENT: Negative.   Eyes: Negative.   Respiratory: Negative.   Cardiovascular: Negative.   Gastrointestinal: Negative.   Endocrine: Negative.   Genitourinary: Negative.   Musculoskeletal: Positive for arthralgias (right elbow pain ).  Skin: Negative.   Allergic/Immunologic: Negative.   Neurological: Negative.   Hematological: Negative.   Psychiatric/Behavioral: Negative.     Social History      She  reports that she quit smoking about 10 years ago. Her smoking use included cigarettes. She has a 2.50 pack-year smoking history. She has never used smokeless tobacco. She reports that she does not drink alcohol or use drugs.       Social History   Socioeconomic History  . Marital status: Married    Spouse name: Not on file  . Number of children: Not on file  . Years of education: Not on file  . Highest education level: Not on file  Occupational History    . Not on file  Social Needs  . Financial resource strain: Not on file  . Food insecurity:    Worry: Not on file    Inability: Not on file  . Transportation needs:    Medical: Not on file    Non-medical: Not on file  Tobacco Use  . Smoking status: Former Smoker    Packs/day: 0.25    Years: 10.00    Pack years: 2.50    Types: Cigarettes    Last attempt to quit: 06/23/2008    Years since quitting: 10.0  . Smokeless tobacco: Never Used  Substance and Sexual Activity  . Alcohol use: No  . Drug use: No  . Sexual activity: Yes  Lifestyle  . Physical activity:    Days per week: Not on file    Minutes per session: Not on file  . Stress: Not on file  Relationships  . Social connections:    Talks on phone: Not on file    Gets together: Not on file    Attends religious service: Not on file    Active member of club or organization: Not on file    Attends meetings of clubs or organizations: Not on file    Relationship status: Not on file  Other Topics Concern  . Not on file  Social History Narrative  . Not on file    Past Medical History:  Diagnosis Date  . Anemia    pregnancy only  . Complication of anesthesia   .  Hypothyroidism    h/o thyroidectomy  . Macrosomia   . Malignant neoplasm of thyroid gland ([place])   . Papillary carcinoma of thyroid ([place]) 2016  . PONV (postoperative nausea and vomiting)      Patient Active Problem List   Diagnosis Date Noted  . Postpartum care following vaginal delivery 12/25/2016  . Normal labor 12/23/2016  . Abnormal vaginal Pap smear 07/12/2016  . Anemia 07/12/2016  . Hypothyroidism 07/12/2016  . Advanced maternal age in multigravida 05/27/2016  . Thyroid cancer ([place]) 12/08/2015  . Thyroid nodule 06/25/2015  . Indication for care in labor and delivery, antepartum 03/10/2015  . Non-atypical endometrial cells on cervical Pap smear 08/06/2014  . Obesity 08/06/2014  . Vitamin D deficiency 08/06/2014  . Hepatic steatosis 05/09/2014     Past Surgical History:  Procedure Laterality Date  . BILATERAL SALPINGECTOMY    . COLPOSCOPY  07/2012   neg  . LAPAROSCOPIC TUBAL LIGATION N/A 02/03/2017   Procedure: LAPAROSCOPIC TUBAL LIGATION via SALPINGECTOMY;  Surgeon: Malachy Mood, MD;  Location: [facility] ORS;  Service: Gynecology;  Laterality: N/A;  . THYROIDECTOMY N/A 06/25/2015   Procedure: Total thyroidectomy with laryngeal nerve monitoring ;  Surgeon: Clyde Canterbury, MD;  Location: [facility] ORS;  Service: ENT;  Laterality: N/A;  . TUBAL LIGATION  02/03/2017  . WISDOM TOOTH EXTRACTION      Family History        No family status information on file.        Her family history is not on file.      Allergies  Allergen Reactions  . Codeine Other (See Comments)    Childhood reaction  Reaction:  Unknown      Current Outpatient Medications:  .  levothyroxine (SYNTHROID, LEVOTHROID) 150 MCG tablet, Take by mouth., Disp: , Rfl:  No current facility-administered medications for this visit.   Facility-Administered Medications Ordered in Other Visits:  .  bupivacaine (SENSORCAINE-MPF) 0.75 % injection, , , Anesthesia Intra-op, Gunnar Bulla, MD, 0.5 mL at 03/11/15 0428   Patient Care Team: Margo Common, PA as PCP - General (Family Medicine)      Objective:   Vitals: BP 120/88 (BP Location: Right Arm, Patient Position: Sitting, Cuff Size: Normal)   Pulse 73   Temp 97.9 F (36.6 C) (Oral)   Ht 5\' 6"  (1.676 m)   Wt 217 lb 9.6 oz (98.7 kg)   SpO2 97%   BMI 35.12 kg/m    Vitals:   07/05/18 0959  BP: 120/88  Pulse: 73  Temp: 97.9 F (36.6 C)  TempSrc: Oral  SpO2: 97%  Weight: 217 lb 9.6 oz (98.7 kg)  Height: 5\' 6"  (1.676 m)     Physical Exam  Constitutional: She is oriented to person, place, and time. She appears well-developed and well-nourished. No distress.  HENT:  Head: Normocephalic and atraumatic.  Right Ear: External ear normal.  Left Ear: External ear normal.  Nose: Nose normal.  Mouth/Throat:  Oropharynx is clear and moist.  Eyes: Pupils are equal, round, and reactive to light. Conjunctivae and EOM are normal. No scleral icterus.  Neck: Neck supple. No thyromegaly (surgically absent) present.  Cardiovascular: Normal rate, regular rhythm, normal heart sounds and intact distal pulses.  No murmur heard. Pulmonary/Chest: Effort normal and breath sounds normal. No respiratory distress. She has no wheezes. She has no rales.  Abdominal: Soft. Bowel sounds are normal. She exhibits no distension. There is no tenderness. There is no rebound and no guarding.  Musculoskeletal: She exhibits no  edema or deformity.  Lymphadenopathy:    She has no cervical adenopathy.  Neurological: She is alert and oriented to person, place, and time.  Skin: Skin is warm and dry. Capillary refill takes less than 2 seconds. No rash noted.  Psychiatric: She has a normal Gordon and affect. Her behavior is normal.  Vitals reviewed.    Depression Screen PHQ 2/9 Scores 07/05/2018  PHQ - 2 Score 0  PHQ- 9 Score 0     Assessment & Plan:     Routine Health Maintenance and Physical Exam  Exercise Activities and Dietary recommendations Goals   None     Immunization History  Administered Date(s) Administered  . Influenza-Unspecified 06/29/2018    Health Maintenance  Topic Date Due  . TETANUS/TDAP  02/08/1998  . PAP SMEAR  05/08/2019  . INFLUENZA VACCINE  Completed  . HIV Screening  Completed     Discussed health benefits of physical activity, and encouraged her to engage in regular exercise appropriate for her age and condition.    --------------------------------------------------------------------  Problem List Items Addressed This Visit      Digestive   Hepatic steatosis   Relevant Orders   Comprehensive metabolic panel     Endocrine   Hypothyroidism     Other   Obesity   Relevant Orders   Lipid panel   Comprehensive metabolic panel   Vitamin D deficiency   Relevant Orders    VITAMIN D 25 Hydroxy (Vit-D Deficiency, Fractures)    Other Visit Diagnoses    Encounter for annual physical exam    -  Primary   Relevant Orders   Lipid panel   Comprehensive metabolic panel   CBC w/Diff/Platelet   VITAMIN D 25 Hydroxy (Vit-D Deficiency, Fractures)       Return in about 1 year (around 07/06/2019) for CPE.  Will send ROI to [facility] to update pap smear and TDAP documentation.   The entirety of the information documented in the History of Present Illness, Review of Systems and Physical Exam were personally obtained by me. Portions of this information were initially documented by Tiburcio Pea, CMA and reviewed by me for thoroughness and accuracy.    Virginia Crews, MD, MPH [facility] 07/05/2018 12:53 PM

## 2018-07-05 NOTE — Patient Instructions (Signed)
Preventive Care 18-39 Years, Female Preventive care refers to lifestyle choices and visits with your health care provider that can promote health and wellness. What does preventive care include?  A yearly physical exam. This is also called an annual well check.  Dental exams once or twice a year.  Routine eye exams. Ask your health care provider how often you should have your eyes checked.  Personal lifestyle choices, including: ? Daily care of your teeth and gums. ? Regular physical activity. ? Eating a healthy diet. ? Avoiding tobacco and drug use. ? Limiting alcohol use. ? Practicing safe sex. ? Taking vitamin and mineral supplements as recommended by your health care provider. What happens during an annual well check? The services and screenings done by your health care provider during your annual well check will depend on your age, overall health, lifestyle risk factors, and family history of disease. Counseling Your health care provider may ask you questions about your:  Alcohol use.  Tobacco use.  Drug use.  Emotional well-being.  Home and relationship well-being.  Sexual activity.  Eating habits.  Work and work Statistician.  Method of birth control.  Menstrual cycle.  Pregnancy history.  Screening You may have the following tests or measurements:  Height, weight, and BMI.  Diabetes screening. This is done by checking your blood sugar (glucose) after you have not eaten for a while (fasting).  Blood pressure.  Lipid and cholesterol levels. These may be checked every 5 years starting at age 66.  Skin check.  Hepatitis C blood test.  Hepatitis B blood test.  Sexually transmitted disease (STD) testing.  BRCA-related cancer screening. This may be done if you have a family history of breast, ovarian, tubal, or peritoneal cancers.  Pelvic exam and Pap test. This may be done every 3 years starting at age 40. Starting at age 59, this may be done every 5  years if you have a Pap test in combination with an HPV test.  Discuss your test results, treatment options, and if necessary, the need for more tests with your health care provider. Vaccines Your health care provider may recommend certain vaccines, such as:  Influenza vaccine. This is recommended every year.  Tetanus, diphtheria, and acellular pertussis (Tdap, Td) vaccine. You may need a Td booster every 10 years.  Varicella vaccine. You may need this if you have not been vaccinated.  HPV vaccine. If you are 69 or younger, you may need three doses over 6 months.  Measles, mumps, and rubella (MMR) vaccine. You may need at least one dose of MMR. You may also need a second dose.  Pneumococcal 13-valent conjugate (PCV13) vaccine. You may need this if you have certain conditions and were not previously vaccinated.  Pneumococcal polysaccharide (PPSV23) vaccine. You may need one or two doses if you smoke cigarettes or if you have certain conditions.  Meningococcal vaccine. One dose is recommended if you are age 27-21 years and a first-year college student living in a residence hall, or if you have one of several medical conditions. You may also need additional booster doses.  Hepatitis A vaccine. You may need this if you have certain conditions or if you travel or work in places where you may be exposed to hepatitis A.  Hepatitis B vaccine. You may need this if you have certain conditions or if you travel or work in places where you may be exposed to hepatitis B.  Haemophilus influenzae type b (Hib) vaccine. You may need this if  you have certain risk factors.  Talk to your health care provider about which screenings and vaccines you need and how often you need them. This information is not intended to replace advice given to you by your health care provider. Make sure you discuss any questions you have with your health care provider. Document Released: 11/30/2001 Document Revised: 06/23/2016  Document Reviewed: 08/05/2015 Elsevier Interactive Patient Education  [employer].

## 2018-07-08 LAB — COMPREHENSIVE METABOLIC PANEL
ALT: 11 IU/L (ref 0–32)
AST: 13 IU/L (ref 0–40)
Albumin/Globulin Ratio: 2 (ref 1.2–2.2)
Albumin: 4.4 g/dL (ref 3.5–5.5)
Alkaline Phosphatase: 100 IU/L (ref 39–117)
BUN/Creatinine Ratio: 11 (ref 9–23)
BUN: 8 mg/dL (ref 6–20)
Bilirubin Total: 0.4 mg/dL (ref 0.0–1.2)
CALCIUM: 9.1 mg/dL (ref 8.7–10.2)
CO2: 23 mmol/L (ref 20–29)
Chloride: 102 mmol/L (ref 96–106)
Creatinine, Ser: 0.74 mg/dL (ref 0.57–1.00)
GFR calc Af Amer: 118 mL/min/{1.73_m2} (ref >59)
GFR, EST NON AFRICAN AMERICAN: 102 mL/min/{1.73_m2} (ref >59)
GLOBULIN, TOTAL: 2.2 g/dL (ref 1.5–4.5)
Glucose: 83 mg/dL (ref 65–99)
POTASSIUM: 4.2 mmol/L (ref 3.5–5.2)
SODIUM: 141 mmol/L (ref 134–144)
Total Protein: 6.6 g/dL (ref 6.0–8.5)

## 2018-07-08 LAB — CBC WITH DIFFERENTIAL/PLATELET
BASOS: 1 %
Basophils Absolute: 0 10*3/uL (ref 0.0–0.2)
EOS (ABSOLUTE): 0.1 10*3/uL (ref 0.0–0.4)
Eos: 2 %
Hematocrit: 36.6 % (ref 34.0–46.6)
Hemoglobin: 11.9 g/dL (ref 11.1–15.9)
IMMATURE GRANULOCYTES: 0 %
Immature Grans (Abs): 0 10*3/uL (ref 0.0–0.1)
LYMPHS: 26 %
Lymphocytes Absolute: 1.4 10*3/uL (ref 0.7–3.1)
MCH: 28.1 pg (ref 26.6–33.0)
MCHC: 32.5 g/dL (ref 31.5–35.7)
MCV: 86 fL (ref 79–97)
Monocytes Absolute: 0.5 10*3/uL (ref 0.1–0.9)
Monocytes: 9 %
NEUTROS PCT: 62 %
Neutrophils Absolute: 3.5 10*3/uL (ref 1.4–7.0)
Platelets: 220 10*3/uL (ref 150–450)
RBC: 4.24 x10E6/uL (ref 3.77–5.28)
RDW: 12.3 % (ref 12.3–15.4)
WBC: 5.6 10*3/uL (ref 3.4–10.8)

## 2018-07-08 LAB — LIPID PANEL
CHOLESTEROL TOTAL: 181 mg/dL (ref 100–199)
Chol/HDL Ratio: 3.1 ratio (ref 0.0–4.4)
HDL: 59 mg/dL (ref >39)
LDL CALC: 109 mg/dL — AB (ref 0–99)
TRIGLYCERIDES: 64 mg/dL (ref 0–149)
VLDL Cholesterol Cal: 13 mg/dL (ref 5–40)

## 2018-07-08 LAB — VITAMIN D 25 HYDROXY (VIT D DEFICIENCY, FRACTURES): Vit D, 25-Hydroxy: 29.2 ng/mL — ABNORMAL LOW (ref 30.0–100.0)

## 2018-07-10 ENCOUNTER — Telehealth: Payer: Self-pay

## 2018-07-10 NOTE — Telephone Encounter (Signed)
-----   Message from Virginia Crews, MD sent at 07/10/2018  8:49 AM EDT ----- Normal cholesterol, blood sugar, blood counts, kidney function, liver function, electrolytes.  Vitamin D level is very slightly low.  Recommend over-the-counter vitamin D3 1000 units daily for supplementation.  Virginia Crews, MD, MPH [facility] 07/10/2018 8:49 AM

## 2018-07-10 NOTE — Telephone Encounter (Signed)
lmtcb

## 2018-07-13 NOTE — Telephone Encounter (Signed)
LMTCB

## 2018-07-17 NOTE — Telephone Encounter (Signed)
Pt advised.   Thanks,   -Laura  

## 2018-07-25 ENCOUNTER — Other Ambulatory Visit: Payer: Self-pay

## 2018-07-25 ENCOUNTER — Emergency Department
Admission: EM | Admit: 2018-07-25 | Discharge: 2018-07-25 | Disposition: A | Discharge status: Home / Self Care | Payer: 59 | Attending: Emergency Medicine | Admitting: Emergency Medicine

## 2018-07-25 ENCOUNTER — Encounter: Payer: Self-pay | Admitting: *Deleted

## 2018-07-25 DIAGNOSIS — S61217A Laceration without foreign body of left little finger without damage to nail, initial encounter: Secondary | ICD-10-CM | POA: Insufficient documentation

## 2018-07-25 DIAGNOSIS — Y999 Unspecified external cause status: Secondary | ICD-10-CM | POA: Diagnosis not present

## 2018-07-25 DIAGNOSIS — Z79899 Other long term (current) drug therapy: Secondary | ICD-10-CM | POA: Diagnosis not present

## 2018-07-25 DIAGNOSIS — Y929 Unspecified place or not applicable: Secondary | ICD-10-CM | POA: Insufficient documentation

## 2018-07-25 DIAGNOSIS — S61216A Laceration without foreign body of right little finger without damage to nail, initial encounter: Secondary | ICD-10-CM

## 2018-07-25 DIAGNOSIS — W268XXA Contact with other sharp object(s), not elsewhere classified, initial encounter: Secondary | ICD-10-CM | POA: Insufficient documentation

## 2018-07-25 DIAGNOSIS — E039 Hypothyroidism, unspecified: Secondary | ICD-10-CM | POA: Insufficient documentation

## 2018-07-25 DIAGNOSIS — Z87891 Personal history of nicotine dependence: Secondary | ICD-10-CM | POA: Diagnosis not present

## 2018-07-25 DIAGNOSIS — Z23 Encounter for immunization: Secondary | ICD-10-CM | POA: Diagnosis not present

## 2018-07-25 DIAGNOSIS — Y939 Activity, unspecified: Secondary | ICD-10-CM | POA: Diagnosis not present

## 2018-07-25 MED ORDER — CEPHALEXIN 500 MG PO CAPS: 500.0000 mg | ORAL_CAPSULE | Freq: Four times a day (QID) | ORAL | 0 refills | Status: AC

## 2018-07-25 MED ORDER — LIDOCAINE HCL (PF) 1 % IJ SOLN
5.0000 mL | Freq: Once | INTRAMUSCULAR | Status: AC
  Administered 2018-07-25: 5 mL via INTRADERMAL
  Filled 2018-07-25: qty 5

## 2018-07-25 MED ORDER — TETANUS-DIPHTH-ACELL PERTUSSIS 5-2.5-18.5 LF-MCG/0.5 IM SUSP
0.5000 mL | Freq: Once | INTRAMUSCULAR | Status: AC
  Administered 2018-07-25: 0.5 mL via INTRAMUSCULAR
  Filled 2018-07-25: qty 0.5

## 2018-07-25 NOTE — ED Triage Notes (Signed)
Laceration to the right pinky from a can this evening. Bleeding under control. Sensation intact. Color appropriate.

## 2018-07-25 NOTE — ED Provider Notes (Signed)
[facility] Emergency Department Provider Note  ____________________________________________  Time seen: Approximately 7:26 PM  I have reviewed the triage vital signs and the nursing notes.   HISTORY  Chief Complaint Laceration    HPI Brooke Gordon is a 37 y.o. female that presents emergency department for evaluation of laceration to left little finger tonight on a can tonight.  Tetanus shot is not up-to-date.   Past Medical History:  Diagnosis Date  . Anemia    pregnancy only  . Complication of anesthesia   . Hypothyroidism    h/o thyroidectomy  . Macrosomia   . Malignant neoplasm of thyroid gland ([place])   . Papillary carcinoma of thyroid ([place]) 2016  . PONV (postoperative nausea and vomiting)     Patient Active Problem List   Diagnosis Date Noted  . Abnormal vaginal Pap smear 07/12/2016  . Anemia 07/12/2016  . Hypothyroidism 07/12/2016  . Thyroid cancer ([place]) 12/08/2015  . Thyroid nodule 06/25/2015  . Non-atypical endometrial cells on cervical Pap smear 08/06/2014  . Obesity 08/06/2014  . Vitamin D deficiency 08/06/2014  . Hepatic steatosis 05/09/2014    Past Surgical History:  Procedure Laterality Date  . BILATERAL SALPINGECTOMY    . COLPOSCOPY  07/2012   neg  . LAPAROSCOPIC TUBAL LIGATION N/A 02/03/2017   Procedure: LAPAROSCOPIC TUBAL LIGATION via SALPINGECTOMY;  Surgeon: Malachy Mood, MD;  Location: [facility] ORS;  Service: Gynecology;  Laterality: N/A;  . THYROIDECTOMY N/A 06/25/2015   Procedure: Total thyroidectomy with laryngeal nerve monitoring ;  Surgeon: Clyde Canterbury, MD;  Location: [facility] ORS;  Service: ENT;  Laterality: N/A;  . TUBAL LIGATION  02/03/2017  . WISDOM TOOTH EXTRACTION      Prior to Admission medications   Medication Sig Start Date End Date Taking? Authorizing Provider  cephALEXin (KEFLEX) 500 MG capsule Take 1 capsule (500 mg total) by mouth 4 (four) times daily for 10 days. 07/25/18 08/04/18  Laban Emperor, PA-C   levothyroxine (SYNTHROID, LEVOTHROID) 150 MCG tablet Take by mouth. 03/01/17   [provider]    Allergies Codeine  History reviewed. No pertinent family history.  Social History Social History   Tobacco Use  . Smoking status: Former Smoker    Packs/day: 0.25    Years: 10.00    Pack years: 2.50    Types: Cigarettes    Last attempt to quit: 06/23/2008    Years since quitting: 10.0  . Smokeless tobacco: Never Used  Substance Use Topics  . Alcohol use: No  . Drug use: No     Review of Systems  Gastrointestinal: No nausea, no vomiting.  Musculoskeletal: Positive for finger pain. Skin: Negative for rash, ecchymosis.  Positive for finger laceration. Neurological: Negative for numbness or tingling   ____________________________________________   PHYSICAL EXAM:  VITAL SIGNS: ED Triage Vitals  Enc Vitals Group     BP 07/25/18 1915 (!) 149/82     Pulse Rate 07/25/18 1915 64     Resp 07/25/18 1915 16     Temp 07/25/18 1915 98.4 F (36.9 C)     Temp Source 07/25/18 1915 Oral     SpO2 07/25/18 1915 98 %     Weight 07/25/18 1916 217 lb 9.6 oz (98.7 kg)     Height 07/25/18 1916 5\' 6"  (1.676 m)     Head Circumference --      Peak Flow --      Pain Score 07/25/18 1916 6     Pain Loc --  Pain Edu? --      Excl. in [place]? --      Constitutional: Alert and oriented. Well appearing and in no acute distress. Eyes: Conjunctivae are normal. PERRL. EOMI. Head: Atraumatic. ENT:      Ears:      Nose: No congestion/rhinnorhea.      Mouth/Throat: Mucous membranes are moist.  Neck: No stridor. Cardiovascular: Good peripheral circulation. Respiratory: Good air entry to the bases with no decreased or absent breath sounds. Musculoskeletal: Full range of motion to all extremities. No gross deformities appreciated.  Able to perform resisted flexion and extension of finger. Neurologic:  Normal speech and language. No gross focal neurologic deficits are appreciated.  Skin:   Skin is warm, dry.  1.5 cm laceration from proximal to distal volar left little finger. Psychiatric: Mood and affect are normal. Speech and behavior are normal. Patient exhibits appropriate insight and judgement.   ____________________________________________   LABS (all labs ordered are listed, but only abnormal results are displayed)  Labs Reviewed - No data to display ____________________________________________  EKG   ____________________________________________  RADIOLOGY   No results found.  ____________________________________________    PROCEDURES  Procedure(s) performed:    Procedures  LACERATION REPAIR Performed by: Laban Emperor  Consent: Verbal consent obtained.  Consent given by: patient  Prepped and Draped in normal sterile fashion  Wound explored: No foreign bodies   Laceration Location: little finger  Laceration Length: 1.5 cm  Anesthesia: None  Local anesthetic: lidocaine 1% without epinephrine  Anesthetic total: 5 ml  Irrigation method: syringe  Amount of cleaning: 581ml normal saline  Skin closure: 4-0 nylon  Number of sutures: 3  Technique: Simple interrupted  Patient tolerance: Patient tolerated the procedure well with no immediate complications.  Medications  Tdap (BOOSTRIX) injection 0.5 mL (0.5 mLs Intramuscular Given 07/25/18 2004)  lidocaine (PF) (XYLOCAINE) 1 % injection 5 mL (5 mLs Intradermal Given by Other 07/25/18 2124)     ____________________________________________   INITIAL IMPRESSION / ASSESSMENT AND PLAN / ED COURSE  Pertinent labs & imaging results that were available during my care of the patient were reviewed by me and considered in my medical decision making (see chart for details).  Review of the [place] CSRS was performed in accordance of the [place] prior to dispensing any controlled drugs.  Patient's diagnosis is consistent with finger laceration.  Vital signs and exam are reassuring.  Laceration was  cleaned with iodine and normal saline.  Laceration was repaired with stitches.  Splint was placed.  Tetanus shot was updated.  Patient will be discharged home with prescriptions for Keflex. Patient is to follow up with orthopedics as directed. Patient is given ED precautions to return to the ED for any worsening or new symptoms.     ____________________________________________  FINAL CLINICAL IMPRESSION(S) / ED DIAGNOSES  Final diagnoses:  Laceration of right little finger without damage to nail, foreign body presence unspecified, initial encounter      NEW MEDICATIONS STARTED DURING THIS VISIT:  ED Discharge Orders         Ordered    cephALEXin (KEFLEX) 500 MG capsule  4 times daily     07/25/18 2108              This chart was dictated using voice recognition software/Dragon. Despite best efforts to proofread, errors can occur which can change the meaning. Any change was purely unintentional.    Laban Emperor, PA-C 07/25/18 2203    Eula Listen, MD 07/25/18 2230

## 2018-07-28 IMAGING — CT CT HEAD W/O CM
3 series · 16 of 45 positions shown, 19 images · non-contrast
Comparison: None.

CLINICAL DATA: Intractable headaches. Worse than usual. Patient is
14 weeks pregnant. Shielded.

EXAM:
CT HEAD WITHOUT CONTRAST
TECHNIQUE: Contiguous axial images were obtained from the base of the skull
through the vertex without intravenous contrast.

[Series 2: head wo · axial · 0.48mm/px · z∈[-147,-32]mm · 10 of 28 slices shown, 13 images]
[im 3/28  brain]
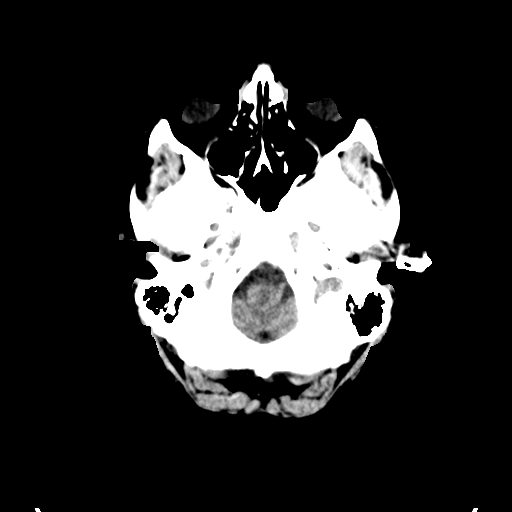
[im 3/28  bone]
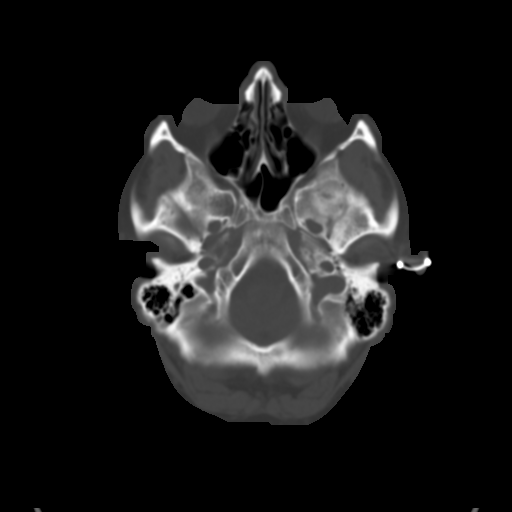
[im 5/28  brain]
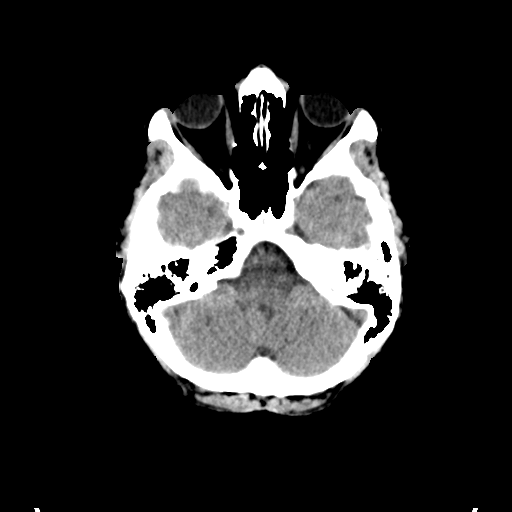
[im 8/28  brain]
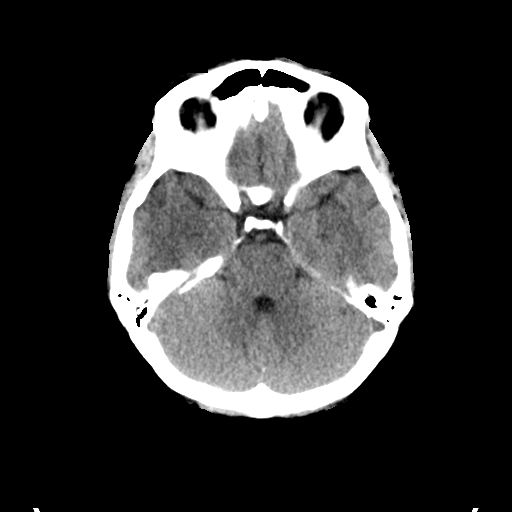
[im 11/28  brain]
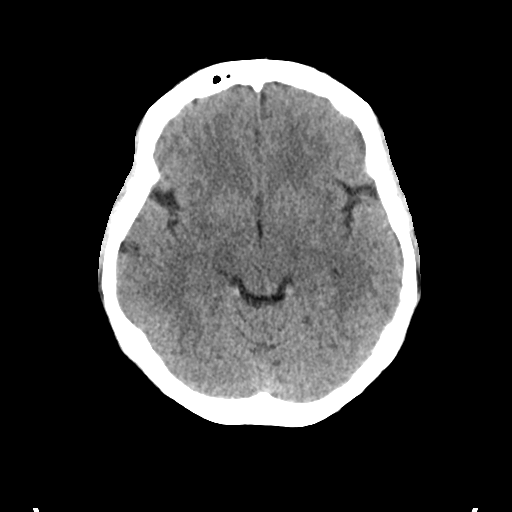
[im 13/28  brain]
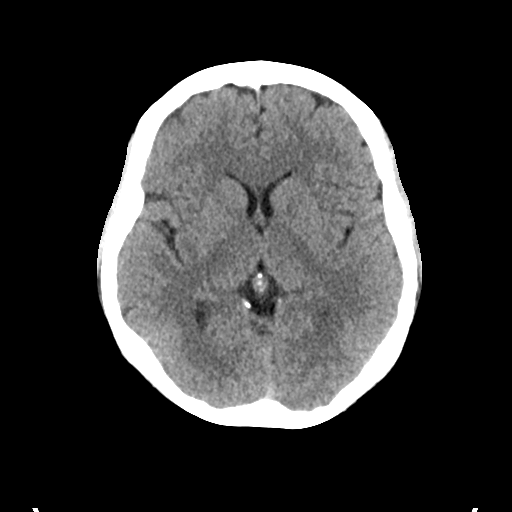
[im 13/28  bone]
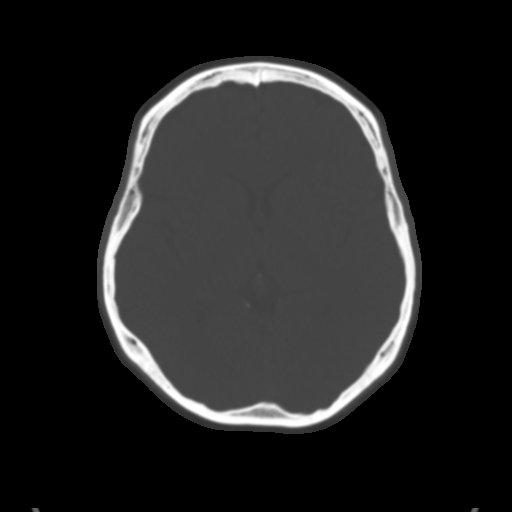
[im 16/28  brain]
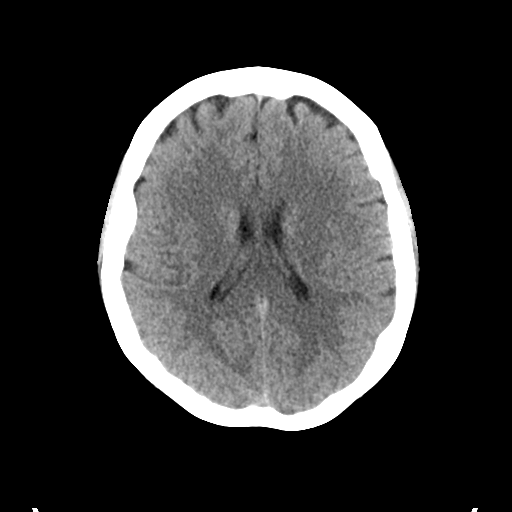
[im 18/28  brain]
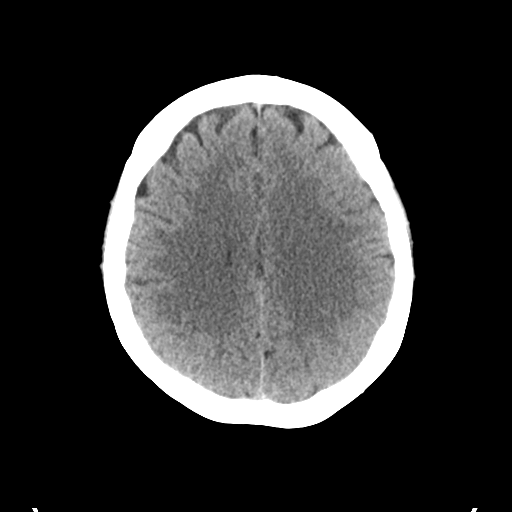
[im 21/28  brain]
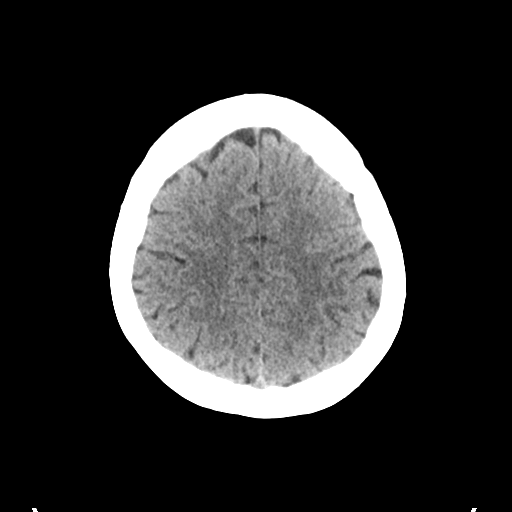
[im 24/28  brain]
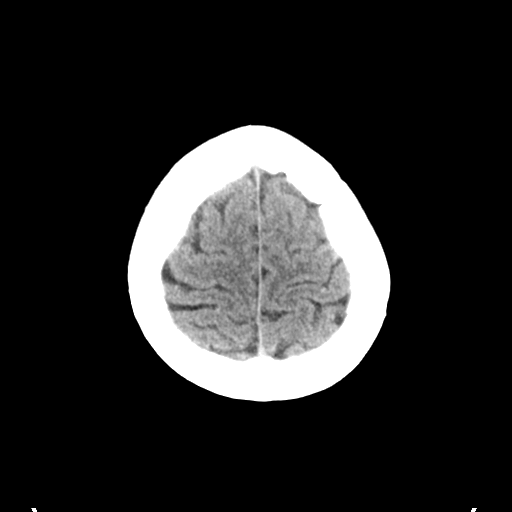
[im 24/28  bone]
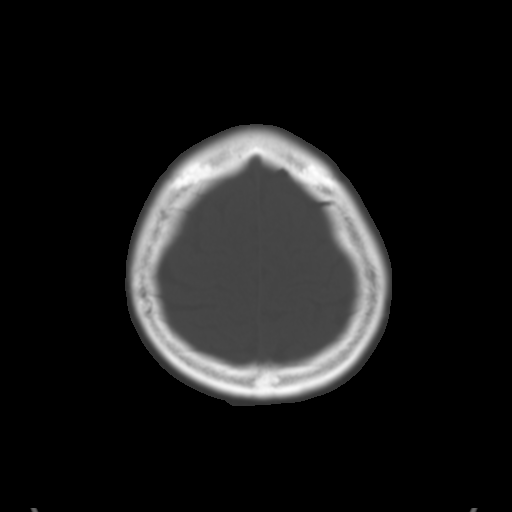
[im 26/28  brain]
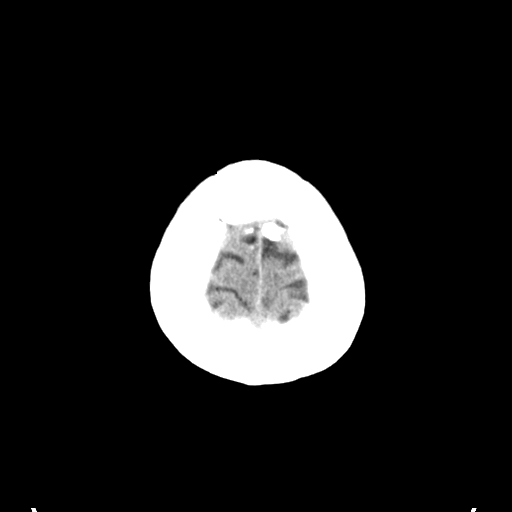

[Series 4: coronal soft tissue · coronal · 0.31mm/px · 3 of 61 slices shown]
[im 21/61  brain]
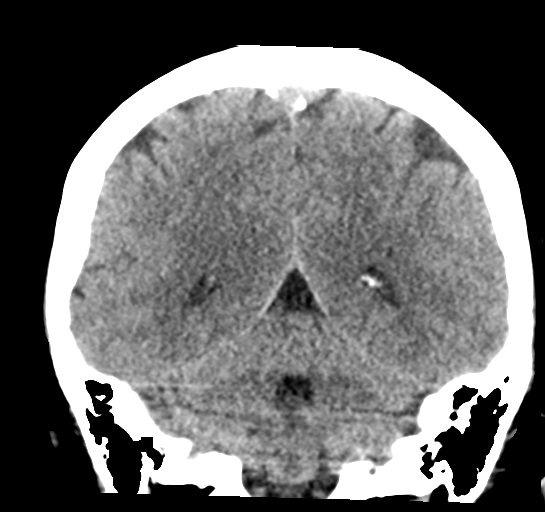
[im 27/61  brain]
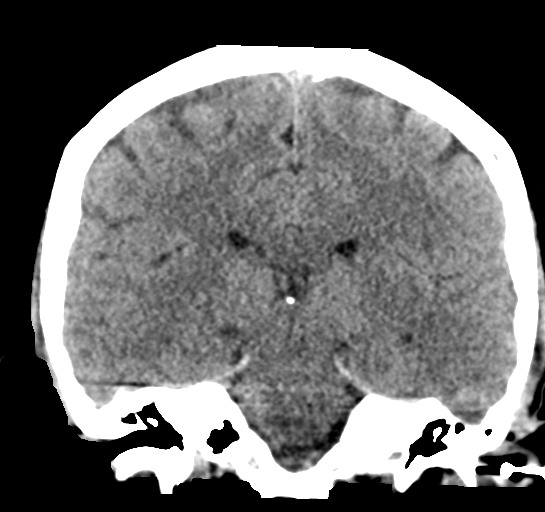
[im 34/61  brain]
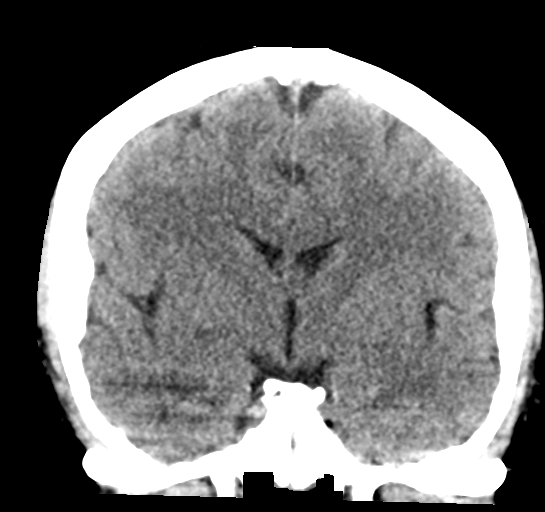

[Series 5: sagittal soft tissue · sagittal · 0.28mm/px · 3 of 51 slices shown]
[im 17/51  brain]
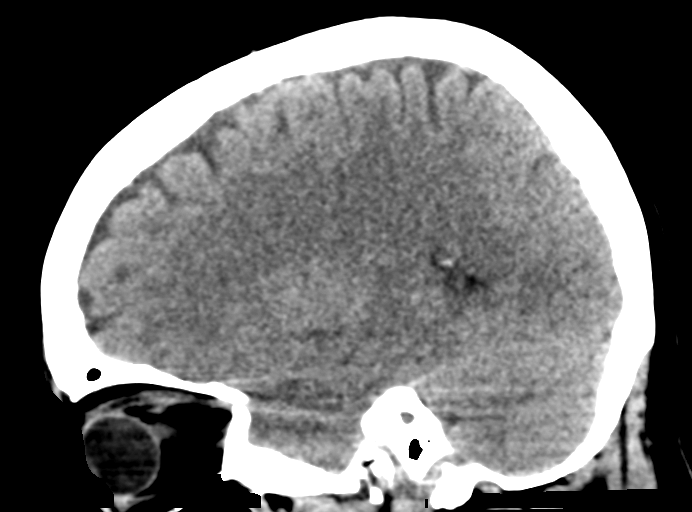
[im 26/51  brain]
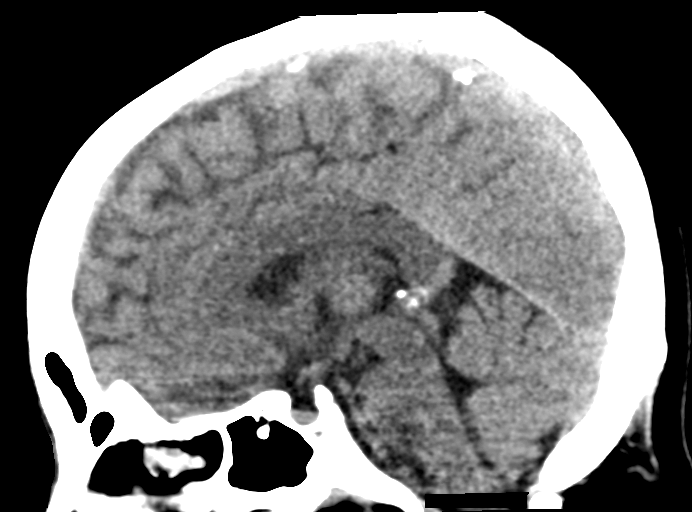
[im 34/51  brain]
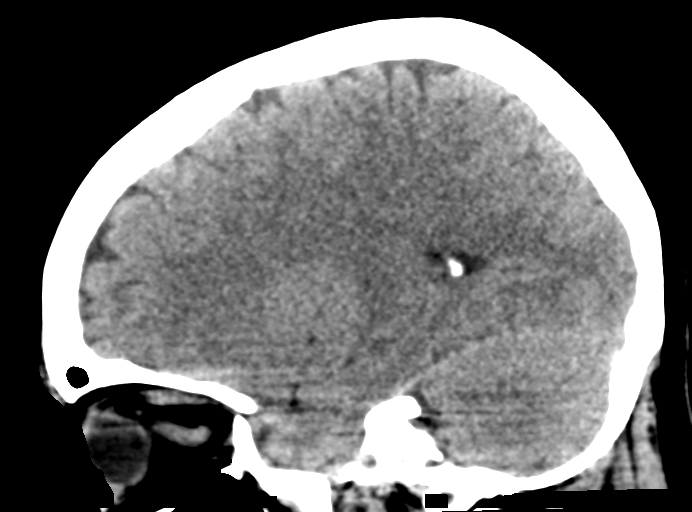

[16 of 45 positions shown; findings below may reference images not displayed]

FINDINGS: Brain: No evidence of acute infarction, hemorrhage, hydrocephalus,
extra-axial collection or mass lesion/mass effect.

Vascular: No hyperdense vessel or unexpected calcification.

Skull: Normal. Negative for fracture or focal lesion.

Sinuses/Orbits: No acute finding.

Other: None.
IMPRESSION: No acute intracranial abnormalities.

## 2018-09-16 IMAGING — CR DG FOOT COMPLETE 3+V*R*
1 series · 3 of 3 positions shown · non-contrast
Comparison: None.

CLINICAL DATA: 35-year-old female fell down stairs last night.
Medial pain. Initial encounter.

EXAM:
RIGHT FOOT COMPLETE - 3+ VIEW

[Series 1: dg foot complete right · 0.14mm/px · 3 of 3 slices shown]
[im 1/3]
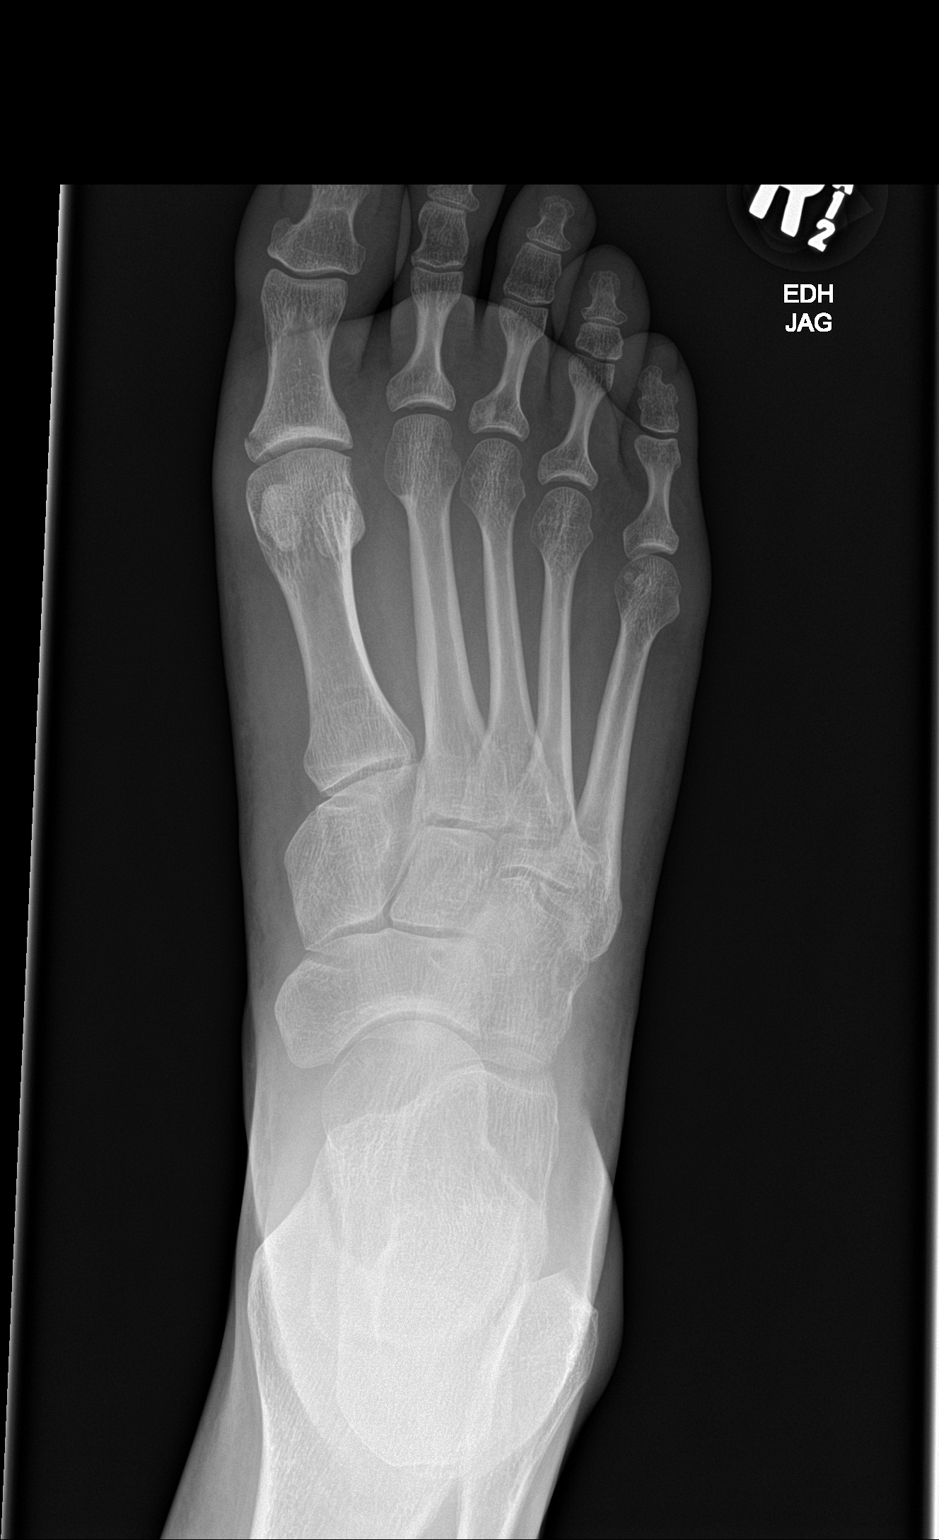
[im 2/3]
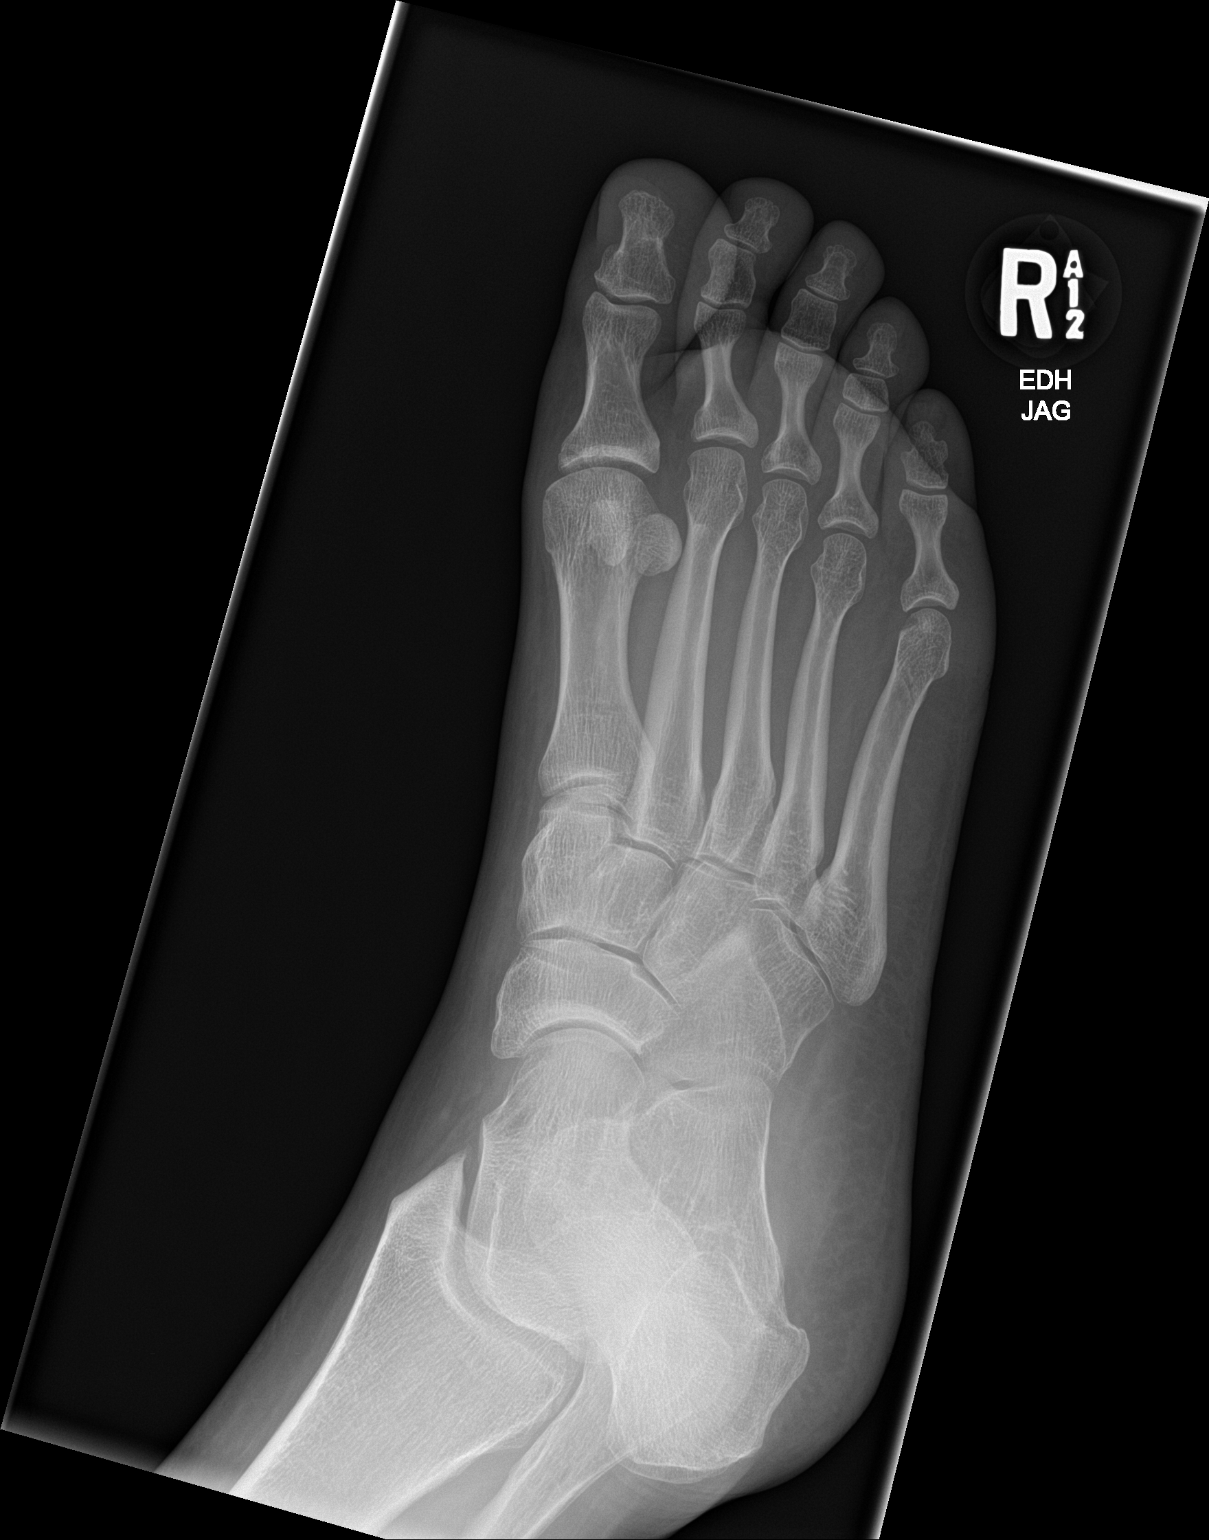
[im 3/3]
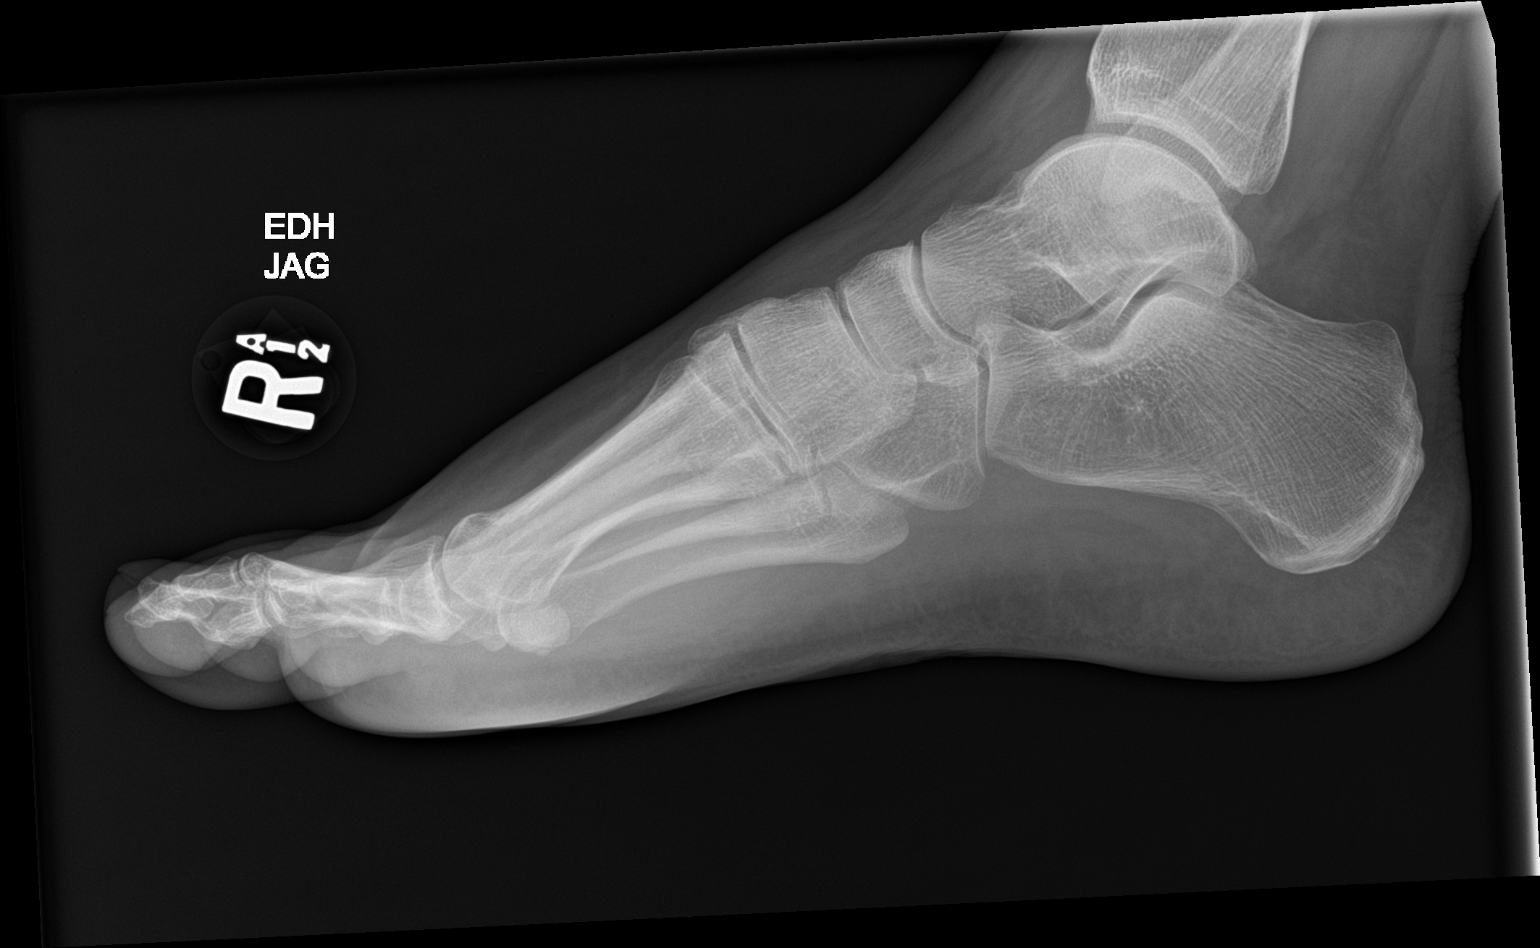

[3 of 3 positions shown; findings below may reference images not displayed]

FINDINGS: Fracture right first proximal phalanx medial aspect with
intra-articular extension

No other fracture or dislocation noted.
IMPRESSION: Fracture right first proximal phalanx medial aspect with
intra-articular extension

## 2019-07-09 ENCOUNTER — Encounter: Payer: 59 | Admitting: Family Medicine

## 2020-08-25 ENCOUNTER — Other Ambulatory Visit: Payer: Self-pay | Admitting: Internal Medicine

## 2020-09-03 ENCOUNTER — Encounter: Payer: Self-pay | Admitting: Family Medicine

## 2020-09-03 NOTE — Progress Notes (Signed)
Complete physical exam   Patient: Brooke Gordon   DOB: Dec 01, 1980   39 y.o. Female  MRN: [id number] Visit Date: 09/05/2020  Today's healthcare provider: Mar Daring, PA-C   Chief Complaint  Patient presents with  . Annual Exam   Subjective    Brooke Gordon is a 39 y.o. female who presents today for a complete physical exam.  She reports consuming a general diet. Home exercise routine includes walking-mild her left hip has been bothering for the past few months.. She generally feels well. She reports sleeping well. She does have additional problems to discuss today.  HPI  Hip Pain: she reports that it bother hers and feels weak.  The majority of pain is when she is lying down. Some weakness when she climbs stairs, Is not constant. It is not aggravated by mobility.  Also has numbness in her fingers bilaterally. It comes and goes. Initially was occurring when she would have an alcoholic drink. Now has been happening without any alcohol or inciting event.   Flu shot: at [facility] 08/14/20   Past Medical History:  Diagnosis Date  . Anemia    pregnancy only  . Complication of anesthesia   . Hypothyroidism    h/o thyroidectomy  . Macrosomia   . Malignant neoplasm of thyroid gland ([place])   . 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 PONV (postoperative nausea and vomiting)    Past Surgical History:  Procedure Laterality Date  . BILATERAL SALPINGECTOMY    . COLPOSCOPY  07/2012   neg  . LAPAROSCOPIC TUBAL LIGATION N/A 02/03/2017   Procedure: LAPAROSCOPIC TUBAL LIGATION via SALPINGECTOMY;  Surgeon: Malachy Mood, MD;  Location: [facility] ORS;  Service: Gynecology;  Laterality: N/A;  . THYROIDECTOMY N/A 06/25/2015   Procedure: Total thyroidectomy with laryngeal nerve monitoring ;  Surgeon: Clyde Canterbury, MD;  Location: [facility] ORS;  Service: ENT;  Laterality: N/A;  . TUBAL LIGATION  02/03/2017  . WISDOM TOOTH EXTRACTION     Social History   Socioeconomic History  .  Marital status: Married    Spouse name: Not on file  . Number of children: Not on file  . Years of education: Not on file  . Highest education level: Not on file  Occupational History  . Not on file  Tobacco Use  . Smoking status: Former Smoker    Packs/day: 0.25    Years: 10.00    Pack years: 2.50    Types: Cigarettes    Quit date: 06/23/2008    Years since quitting: 12.2  . Smokeless tobacco: Never Used  Vaping Use  . Vaping Use: Never used  Substance and Sexual Activity  . Alcohol use: No  . Drug use: No  . Sexual activity: Yes  Other Topics Concern  . Not on file  Social History Narrative  . Not on file   Social Determinants of Health   Financial Resource Strain:   . Difficulty of Paying Living Expenses: Not on file  Food Insecurity:   . Worried About Charity fundraiser in the Last Year: Not on file  . Ran Out of Food in the Last Year: Not on file  Transportation Needs:   . Lack of Transportation (Medical): Not on file  . Lack of Transportation (Non-Medical): Not on file  Physical Activity:   . Days of Exercise per Week: Not on file  . Minutes of Exercise per Session: Not on file  Stress:   . Feeling of Stress :  Not on file  Social Connections:   . Frequency of Communication with Friends and Family: Not on file  . Frequency of Social Gatherings with Friends and Family: Not on file  . Attends Religious Services: Not on file  . Active Member of Clubs or Organizations: Not on file  . Attends Archivist Meetings: Not on file  . Marital Status: Not on file  Intimate Partner Violence:   . Fear of Current or Ex-Partner: Not on file  . Emotionally Abused: Not on file  . Physically Abused: Not on file  . Sexually Abused: Not on file   No family status information on file.   No family history on file. Allergies  Allergen Reactions  . Codeine Other (See Comments)    Childhood reaction  Reaction:  Unknown     Patient Care Team: Chrismon, Vickki Muff, PA  as PCP - General (Family Medicine)   Medications: Outpatient Medications Prior to Visit  Medication Sig  . levothyroxine (SYNTHROID, LEVOTHROID) 150 MCG tablet Take by mouth.   Facility-Administered Medications Prior to Visit  Medication Dose Route Frequency Provider  . bupivacaine (SENSORCAINE-MPF) 0.75 % injection    Anesthesia Intra-op Gunnar Bulla, MD    Review of Systems  Constitutional: Positive for fatigue.  HENT: Negative.   Eyes: Negative.   Respiratory: Negative.   Cardiovascular: Negative.   Gastrointestinal: Negative.   Endocrine: Negative.   Genitourinary: Negative.   Musculoskeletal: Positive for arthralgias and gait problem. Negative for back pain, joint swelling and myalgias.  Skin: Negative.   Allergic/Immunologic: Negative.   Neurological: Positive for weakness (left hip) and numbness (hands).  Hematological: Negative.   Psychiatric/Behavioral: Negative.     Last CBC Lab Results  Component Value Date   WBC 5.6 07/07/2018   HGB 11.9 07/07/2018   HCT 36.6 07/07/2018   MCV 86 07/07/2018   MCH 28.1 07/07/2018   RDW 12.3 07/07/2018   PLT 220 95/28/4132   Last metabolic panel Lab Results  Component Value Date   GLUCOSE 83 07/07/2018   NA 141 07/07/2018   K 4.2 07/07/2018   CL 102 07/07/2018   CO2 23 07/07/2018   BUN 8 07/07/2018   CREATININE 0.74 07/07/2018   GFRNONAA 102 07/07/2018   GFRAA 118 07/07/2018   CALCIUM 9.1 07/07/2018   PHOS 3.7 06/26/2015   PROT 6.6 07/07/2018   ALBUMIN 4.4 07/07/2018   LABGLOB 2.2 07/07/2018   AGRATIO 2.0 07/07/2018   BILITOT 0.4 07/07/2018   ALKPHOS 100 07/07/2018   AST 13 07/07/2018   ALT 11 07/07/2018   ANIONGAP 6 07/11/2016      Objective    BP 138/71 (BP Location: Right Arm, Patient Position: Sitting, Cuff Size: Large)   Pulse 76   Temp 98.2 F (36.8 C) (Oral)   Resp 16   Ht 5\' 6"  (1.676 m)   Wt 255 lb 14.4 oz (116.1 kg)   LMP 08/24/2020   BMI 41.30 kg/m  BP Readings from Last 3 Encounters:    09/05/20 138/71  07/25/18 123/79  07/05/18 120/88   Wt Readings from Last 3 Encounters:  09/05/20 255 lb 14.4 oz (116.1 kg)  07/25/18 217 lb 9.6 oz (98.7 kg)  07/05/18 217 lb 9.6 oz (98.7 kg)      Physical Exam Vitals reviewed.  Constitutional:      General: She is not in acute distress.    Appearance: Normal appearance. She is well-developed. She is obese. She is not ill-appearing or diaphoretic.  HENT:  Head: Normocephalic and atraumatic.     Right Ear: Tympanic membrane, ear canal and external ear normal.     Left Ear: Tympanic membrane, ear canal and external ear normal.  Eyes:     General: No scleral icterus.       Right eye: No discharge.        Left eye: No discharge.     Extraocular Movements: Extraocular movements intact.     Conjunctiva/sclera: Conjunctivae normal.     Pupils: Pupils are equal, round, and reactive to light.  Neck:     Thyroid: No thyromegaly.     Vascular: No carotid bruit or JVD.     Trachea: No tracheal deviation.  Cardiovascular:     Rate and Rhythm: Normal rate and regular rhythm.     Pulses: Normal pulses.     Heart sounds: Normal heart sounds. No murmur heard.  No friction rub. No gallop.   Pulmonary:     Effort: Pulmonary effort is normal. No respiratory distress.     Breath sounds: Normal breath sounds. No wheezing or rales.  Chest:     Chest wall: No tenderness.  Abdominal:     General: Abdomen is flat. Bowel sounds are normal. There is no distension.     Palpations: Abdomen is soft. There is no mass.     Tenderness: There is no abdominal tenderness. There is no guarding or rebound.  Musculoskeletal:        General: No tenderness. Normal range of motion.     Cervical back: Normal range of motion and neck supple. No tenderness.     Right lower leg: No edema.     Left lower leg: No edema.  Lymphadenopathy:     Cervical: No cervical adenopathy.  Skin:    General: Skin is warm and dry.     Capillary Refill: Capillary refill  takes less than 2 seconds.     Findings: No rash.  Neurological:     General: No focal deficit present.     Mental Status: She is alert and oriented to person, place, and time. Mental status is at baseline.  Psychiatric:        Mood and Affect: Mood normal.        Behavior: Behavior normal.        Thought Content: Thought content normal.        Judgment: Judgment normal.     Last depression screening scores PHQ 2/9 Scores 09/05/2020 07/05/2018  PHQ - 2 Score 0 0  PHQ- 9 Score - 0   Last fall risk screening Fall Risk  07/05/2018  Falls in the past year? No   Last Audit-C alcohol use screening Alcohol Use Disorder Test (AUDIT) 09/05/2020  1. How often do you have a drink containing alcohol? 1  2. How many drinks containing alcohol do you have on a typical day when you are drinking? 0  3. How often do you have six or more drinks on one occasion? 0  AUDIT-C Score 1  Alcohol Brief Interventions/Follow-up AUDIT Score <7 follow-up not indicated   A score of 3 or more in women, and 4 or more in men indicates increased risk for alcohol abuse, EXCEPT if all of the points are from question 1   No results found for any visits on 09/05/20.  Assessment & Plan    Routine Health Maintenance and Physical Exam  Exercise Activities and Dietary recommendations Goals   None     Immunization History  Administered  Date(s) Administered  . Influenza-Unspecified 06/29/2018  . Tdap 07/25/2018    Health Maintenance  Topic Date Due  . Hepatitis C Screening  Never done  . COVID-19 Vaccine (1) Never done  . PAP SMEAR-Modifier  05/08/2019  . INFLUENZA VACCINE  05/18/2020  . TETANUS/TDAP  07/25/2028  . HIV Screening  Completed    Discussed health benefits of physical activity, and encouraged her to engage in regular exercise appropriate for her age and condition.  1. Annual physical exam Normal physical exam today. Will check labs as below and f/u pending lab results. If labs are stable  and WNL she will not need to have these rechecked for one year at her next annual physical exam. She is to call the office in the meantime if she has any acute issue, questions or concerns.  2. Encounter for breast cancer screening using non-mammogram modality Breast exam today was normal. There is no family history of breast cancer. She does perform regular self breast exams. Mammogram was ordered as below. Information for [facility] clinic was given to patient so she may schedule her mammogram at her convenience. - MM 3D SCREEN BREAST BILATERAL; Future  3. Menorrhagia with regular cycle New over last 5 months with heavy bleeding and large clots. Occurs with regular cycle and not much cramping. Will check labs as below and f/u pending results. Referral to GYN, Dr. Georgianne Fick, with whom patient has seen in the past.  - CBC with Differential/Platelet - Ambulatory referral to Gynecology - FSH/LH - Estrogens, total - Fe+TIBC+Fer  4. Class 3 severe obesity due to excess calories with serious comorbidity and body mass index (BMI) of 40.0 to 44.9 in adult [facility]) Counseled patient on healthy lifestyle modifications including dieting and exercise. Will check labs as below and f/u pending results. - CBC with Differential/Platelet - Comprehensive metabolic panel - Hemoglobin A1c - Lipid panel - FSH/LH - Estrogens, total - Cortisol - B12 and Folate Panel  5. Colon cancer screening Patient has history of thyroid cancer treated with radioactive iodine. There is increased risk of patients treated with radioactive iodine to develop colon cancer. Referral placed for consideration of early colonoscopy.  - Ambulatory referral to Gastroenterology  6. Cancer of thyroid gland ([place]) Stable. Followed by Dr. Gabriel Carina.  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 - CBC with Differential/Platelet  8. Hepatic  steatosis H/O this many years ago. Was evaluated by GI, Dawson Bills, NP and Dr. Vira Agar. Will check labs as below and f/u pending results. - CBC with Differential/Platelet - Comprehensive metabolic panel - Hemoglobin A1c - Lipid panel - Cortisol  9. Vitamin D deficiency H/O this. Will check labs as below and f/u pending results. - CBC with Differential/Platelet - Vitamin D (25 hydroxy)  10. Left hip pain New. Started in March 2021 and lasted only a few weeks. Now has been pretty persistent. Bothered by stairs and lying in bed. Will get imaging as below to R/O OA. I suspect more bursitis. Will give exercises and stretches on patient instructions in mychart.  - DG Hip Unilat W OR W/O Pelvis 2-3 Views Left; Future  11. Onychomycosis Noted on right great toe, medial and lateral borders, centrally clear. Has tried OTC medication without improvement. Will check liver enzymes as noted above. If normal, will start Lamisil x 3 months.   12. Bilateral hand numbness Checking B12. Possible carpal tunnel. Since intermittent will monitor at this time.  No follow-ups on file.     Reynolds Bowl, PA-C, have reviewed all documentation for this visit. The documentation on 09/05/20 for the exam, diagnosis, procedures, and orders are all accurate and complete.    Rubye Beach  [facility] [phone] (phone) [phone] (fax)  [place]

## 2020-09-05 ENCOUNTER — Other Ambulatory Visit: Payer: Self-pay

## 2020-09-05 ENCOUNTER — Encounter: Payer: Self-pay | Admitting: Physician Assistant

## 2020-09-05 ENCOUNTER — Ambulatory Visit (INDEPENDENT_AMBULATORY_CARE_PROVIDER_SITE_OTHER): Payer: No Typology Code available for payment source | Admitting: Physician Assistant

## 2020-09-05 VITALS — BP 138/71 | HR 76 | Temp 98.2°F | Resp 16 | Ht 66.0 in | Wt 255.9 lb

## 2020-09-05 DIAGNOSIS — Z0000 Encounter for general adult medical examination without abnormal findings: Secondary | ICD-10-CM | POA: Diagnosis not present

## 2020-09-05 DIAGNOSIS — K760 Fatty (change of) liver, not elsewhere classified: Secondary | ICD-10-CM

## 2020-09-05 DIAGNOSIS — B351 Tinea unguium: Secondary | ICD-10-CM | POA: Diagnosis not present

## 2020-09-05 DIAGNOSIS — Z1211 Encounter for screening for malignant neoplasm of colon: Secondary | ICD-10-CM | POA: Diagnosis not present

## 2020-09-05 DIAGNOSIS — C73 Malignant neoplasm of thyroid gland: Secondary | ICD-10-CM

## 2020-09-05 DIAGNOSIS — E890 Postprocedural hypothyroidism: Secondary | ICD-10-CM

## 2020-09-05 DIAGNOSIS — N920 Excessive and frequent menstruation with regular cycle: Secondary | ICD-10-CM | POA: Diagnosis not present

## 2020-09-05 DIAGNOSIS — M25552 Pain in left hip: Secondary | ICD-10-CM | POA: Diagnosis not present

## 2020-09-05 DIAGNOSIS — Z6841 Body Mass Index (BMI) 40.0 and over, adult: Secondary | ICD-10-CM

## 2020-09-05 DIAGNOSIS — Z1239 Encounter for other screening for malignant neoplasm of breast: Secondary | ICD-10-CM | POA: Diagnosis not present

## 2020-09-05 DIAGNOSIS — R200 Anesthesia of skin: Secondary | ICD-10-CM | POA: Diagnosis not present

## 2020-09-05 DIAGNOSIS — E559 Vitamin D deficiency, unspecified: Secondary | ICD-10-CM

## 2020-09-05 NOTE — Patient Instructions (Addendum)
[facility] [place],  [place]  [address] [address]: [phone]   Hip Bursitis Rehab Ask your health care provider which exercises are safe for you. Do exercises exactly as told by your health care provider and adjust them as directed. It is normal to feel mild stretching, pulling, tightness, or discomfort as you do these exercises. Stop right away if you feel sudden pain or your pain gets worse. Do not begin these exercises until told by your health care provider. Stretching exercise This exercise warms up your muscles and joints and improves the movement and flexibility of your hip. This exercise also helps to relieve pain and stiffness. Iliotibial band stretch An iliotibial band is a strong band of muscle tissue that runs from the outer side of your hip to the outer side of your thigh and knee. 1. Lie on your side with your left / right leg in the top position. 2. Bend your left / right knee and grab your ankle. Stretch out your bottom arm to help you balance. 3. Slowly bring your knee back so your thigh is behind your body. 4. Slowly lower your knee toward the floor until you feel a gentle stretch on the outside of your left / right thigh. If you do not feel a stretch and your knee will not fall farther, place the heel of your other foot on top of your knee and pull your knee down toward the floor with your foot. 5. Hold this position for __________ seconds. 6. Slowly return to the starting position. Repeat __________ times. Complete this exercise __________ times a day. Strengthening exercises These exercises build strength and endurance in your hip and pelvis. Endurance is the ability to use your muscles for a long time, even after they get tired. Bridge This exercise strengthens the muscles that move your thigh backward (hip extensors). 1. Lie on your back on a firm surface with your knees bent and your feet flat on the floor. 2. Tighten  your buttocks muscles and lift your buttocks off the floor until your trunk is level with your thighs. ? Do not arch your back. ? You should feel the muscles working in your buttocks and the back of your thighs. If you do not feel these muscles, slide your feet 1-2 inches (2.5-5 cm) farther away from your buttocks. ? If this exercise is too easy, try doing it with your arms crossed over your chest. 3. Hold this position for __________ seconds. 4. Slowly lower your hips to the starting position. 5. Let your muscles relax completely after each repetition. Repeat __________ times. Complete this exercise __________ times a day. Squats This exercise strengthens the muscles in front of your thigh and knee (quadriceps). 1. Stand in front of a table, with your feet and knees pointing straight ahead. You may rest your hands on the table for balance but not for support. 2. Slowly bend your knees and lower your hips like you are going to sit in a chair. ? Keep your weight over your heels, not over your toes. ? Keep your lower legs upright so they are parallel with the table legs. ? Do not let your hips go lower than your knees. ? Do not bend lower than told by your health care provider. ? If your hip pain increases, do not bend as low. 3. Hold the squat position for __________ seconds. 4. Slowly push with your legs to return to standing. Do not use your  hands to pull yourself to standing. Repeat __________ times. Complete this exercise __________ times a day. Hip hike 1. Stand sideways on a bottom step. Stand on your left / right leg with your other foot unsupported next to the step. You can hold on to the railing or wall for balance if needed. 2. Keep your knees straight and your torso square. Then lift your left / right hip up toward the ceiling. 3. Hold this position for __________ seconds. 4. Slowly let your left / right hip lower toward the floor, past the starting position. Your foot should get  closer to the floor. Do not lean or bend your knees. Repeat __________ times. Complete this exercise __________ times a day. Single leg stand 1. Without shoes, stand near a railing or in a doorway. You may hold on to the railing or door frame as needed for balance. 2. Squeeze your left / right buttock muscles, then lift up your other foot. ? Do not let your left / right hip push out to the side. ? It is helpful to stand in front of a mirror for this exercise so you can watch your hip. 3. Hold this position for __________ seconds. Repeat __________ times. Complete this exercise __________ times a day. This information is not intended to replace advice given to you by your health care provider. Make sure you discuss any questions you have with your health care provider. Document Revised: 01/29/2019 Document Reviewed: 01/29/2019 Elsevier Patient Education  2020 Elsevier Inc.    Preventive Care 65-30 Years Old, Female Preventive care refers to visits with your health care provider and lifestyle choices that can promote health and wellness. This includes:  A yearly physical exam. This may also be called an annual well check.  Regular dental visits and eye exams.  Immunizations.  Screening for certain conditions.  Healthy lifestyle choices, such as eating a healthy diet, getting regular exercise, not using drugs or products that contain nicotine and tobacco, and limiting alcohol use. What can I expect for my preventive care visit? Physical exam Your health care provider will check your:  Height and weight. This may be used to calculate body mass index (BMI), which tells if you are at a healthy weight.  Heart rate and blood pressure.  Skin for abnormal spots. Counseling Your health care provider may ask you questions about your:  Alcohol, tobacco, and drug use.  Emotional well-being.  Home and relationship well-being.  Sexual activity.  Eating habits.  Work and work  Statistician.  Method of birth control.  Menstrual cycle.  Pregnancy history. What immunizations do I need?  Influenza (flu) vaccine  This is recommended every year. Tetanus, diphtheria, and pertussis (Tdap) vaccine  You may need a Td booster every 10 years. Varicella (chickenpox) vaccine  You may need this if you have not been vaccinated. Zoster (shingles) vaccine  You may need this after age 53. Measles, mumps, and rubella (MMR) vaccine  You may need at least one dose of MMR if you were born in 1957 or later. You may also need a second dose. Pneumococcal conjugate (PCV13) vaccine  You may need this if you have certain conditions and were not previously vaccinated. Pneumococcal polysaccharide (PPSV23) vaccine  You may need one or two doses if you smoke cigarettes or if you have certain conditions. Meningococcal conjugate (MenACWY) vaccine  You may need this if you have certain conditions. Hepatitis A vaccine  You may need this if you have certain conditions or if  you travel or work in places where you may be exposed to hepatitis A. Hepatitis B vaccine  You may need this if you have certain conditions or if you travel or work in places where you may be exposed to hepatitis B. Haemophilus influenzae type b (Hib) vaccine  You may need this if you have certain conditions. Human papillomavirus (HPV) vaccine  If recommended by your health care provider, you may need three doses over 6 months. You may receive vaccines as individual doses or as more than one vaccine together in one shot (combination vaccines). Talk with your health care provider about the risks and benefits of combination vaccines. What tests do I need? Blood tests  Lipid and cholesterol levels. These may be checked every 5 years, or more frequently if you are over 40 years old.  Hepatitis C test.  Hepatitis B test. Screening  Lung cancer screening. You may have this screening every year starting at  age 29 if you have a 30-pack-year history of smoking and currently smoke or have quit within the past 15 years.  Colorectal cancer screening. All adults should have this screening starting at age 50 and continuing until age 62. Your health care provider may recommend screening at age 45 if you are at increased risk. You will have tests every 1-10 years, depending on your results and the type of screening test.  Diabetes screening. This is done by checking your blood sugar (glucose) after you have not eaten for a while (fasting). You may have this done every 1-3 years.  Mammogram. This may be done every 1-2 years. Talk with your health care provider about when you should start having regular mammograms. This may depend on whether you have a family history of breast cancer.  BRCA-related cancer screening. This may be done if you have a family history of breast, ovarian, tubal, or peritoneal cancers.  Pelvic exam and Pap test. This may be done every 3 years starting at age 29. Starting at age 79, this may be done every 5 years if you have a Pap test in combination with an HPV test. Other tests  Sexually transmitted disease (STD) testing.  Bone density scan. This is done to screen for osteoporosis. You may have this scan if you are at high risk for osteoporosis. Follow these instructions at home: Eating and drinking  Eat a diet that includes fresh fruits and vegetables, whole grains, lean protein, and low-fat dairy.  Take vitamin and mineral supplements as recommended by your health care provider.  Do not drink alcohol if: ? Your health care provider tells you not to drink. ? You are pregnant, may be pregnant, or are planning to become pregnant.  If you drink alcohol: ? Limit how much you have to 0-1 drink a day. ? Be aware of how much alcohol is in your drink. In the [place] one drink equals one 12 oz bottle of beer (355 mL), one 5 oz glass of wine (148 mL), or one 1 oz glass of hard liquor  (44 mL). Lifestyle  Take daily care of your teeth and gums.  Stay active. Exercise for at least 30 minutes on 5 or more days each week.  Do not use any products that contain nicotine or tobacco, such as cigarettes, e-cigarettes, and chewing tobacco. If you need help quitting, ask your health care provider.  If you are sexually active, practice safe sex. Use a condom or other form of birth control (contraception) in order to prevent pregnancy  and STIs (sexually transmitted infections).  If told by your health care provider, take low-dose aspirin daily starting at age 71. What's next?  Visit your health care provider once a year for a well check visit.  Ask your health care provider how often you should have your eyes and teeth checked.  Stay up to date on all vaccines. This information is not intended to replace advice given to you by your health care provider. Make sure you discuss any questions you have with your health care provider. Document Revised: 06/15/2018 Document Reviewed: 06/15/2018 Elsevier Patient Education  2020 [employer].

## 2020-09-08 ENCOUNTER — Telehealth: Payer: Self-pay | Admitting: *Deleted

## 2020-09-08 ENCOUNTER — Other Ambulatory Visit: Payer: Self-pay

## 2020-09-08 ENCOUNTER — Ambulatory Visit
Admission: RE | Admit: 2020-09-08 | Discharge: 2020-09-08 | Disposition: A | Discharge status: Home / Self Care | Payer: No Typology Code available for payment source | Source: Ambulatory Visit | Attending: Physician Assistant | Admitting: Physician Assistant

## 2020-09-08 ENCOUNTER — Ambulatory Visit
Admission: RE | Admit: 2020-09-08 | Discharge: 2020-09-08 | Disposition: A | Discharge status: Home / Self Care | Payer: No Typology Code available for payment source | Attending: Physician Assistant | Admitting: Physician Assistant

## 2020-09-08 DIAGNOSIS — M25552 Pain in left hip: Secondary | ICD-10-CM | POA: Diagnosis not present

## 2020-09-08 NOTE — Telephone Encounter (Signed)
Copied from [place] [id number]. Topic: General - Other >> Sep 08, 2020  2:01 PM Leward Quan A wrote: Reason for CRM: Patient called in to say that she received a notification that she have a new bill from her visit on 09/05/20 and that her insurance [employer] was not filed and she is asking for a call back from the person that is in charge of the billing to discuss. Please call Ph# [phone]

## 2020-09-08 NOTE — Telephone Encounter (Signed)
Please advise 

## 2020-09-09 LAB — CORTISOL: Cortisol: 5 ug/dL

## 2020-09-09 LAB — LIPID PANEL
Chol/HDL Ratio: 3 ratio (ref 0.0–4.4)
Cholesterol, Total: 198 mg/dL (ref 100–199)
HDL: 67 mg/dL (ref >39)
Triglycerides: 93 mg/dL (ref 0–149)

## 2020-09-09 LAB — CBC WITH DIFFERENTIAL/PLATELET
Hematocrit: 34.1 % (ref 34.0–46.6)
Immature Granulocytes: 0 %
MCV: 84 fL (ref 79–97)
Monocytes: 9 %
Neutrophils Absolute: 4 10*3/uL (ref 1.4–7.0)

## 2020-09-09 LAB — ESTROGENS, TOTAL

## 2020-09-09 LAB — COMPREHENSIVE METABOLIC PANEL
ALT: 13 IU/L (ref 0–32)
BUN/Creatinine Ratio: 14 (ref 9–23)
Globulin, Total: 2.5 g/dL (ref 1.5–4.5)

## 2020-09-09 LAB — B12 AND FOLATE PANEL: Vitamin B-12: 745 pg/mL (ref 232–1245)

## 2020-09-09 LAB — IRON,TIBC AND FERRITIN PANEL: UIBC: 322 ug/dL (ref 131–425)

## 2020-09-10 LAB — IRON,TIBC AND FERRITIN PANEL
Ferritin: 14 ng/mL — ABNORMAL LOW (ref 15–150)
Iron Saturation: 10 % — ABNORMAL LOW (ref 15–55)
Iron: 37 ug/dL (ref 27–159)
Total Iron Binding Capacity: 359 ug/dL (ref 250–450)

## 2020-09-10 LAB — HEMOGLOBIN A1C
Est. average glucose Bld gHb Est-mCnc: 111 mg/dL
Hgb A1c MFr Bld: 5.5 % (ref 4.8–5.6)

## 2020-09-10 LAB — CBC WITH DIFFERENTIAL/PLATELET
Basophils Absolute: 0.1 10*3/uL (ref 0.0–0.2)
Basos: 1 %
EOS (ABSOLUTE): 0.2 10*3/uL (ref 0.0–0.4)
Eos: 2 %
Hemoglobin: 11.3 g/dL (ref 11.1–15.9)
Immature Grans (Abs): 0 10*3/uL (ref 0.0–0.1)
Lymphocytes Absolute: 1.7 10*3/uL (ref 0.7–3.1)
Lymphs: 26 %
MCH: 28 pg (ref 26.6–33.0)
MCHC: 33.1 g/dL (ref 31.5–35.7)
Monocytes Absolute: 0.6 10*3/uL (ref 0.1–0.9)
Neutrophils: 62 %
Platelets: 279 10*3/uL (ref 150–450)
RBC: 4.04 x10E6/uL (ref 3.77–5.28)
RDW: 12.4 % (ref 11.7–15.4)
WBC: 6.5 10*3/uL (ref 3.4–10.8)

## 2020-09-10 LAB — LIPID PANEL
LDL Chol Calc (NIH): 114 mg/dL — ABNORMAL HIGH (ref 0–99)
VLDL Cholesterol Cal: 17 mg/dL (ref 5–40)

## 2020-09-10 LAB — B12 AND FOLATE PANEL: Folate: 7.8 ng/mL (ref >3.0)

## 2020-09-10 LAB — COMPREHENSIVE METABOLIC PANEL
AST: 11 IU/L (ref 0–40)
Albumin/Globulin Ratio: 1.7 (ref 1.2–2.2)
Albumin: 4.3 g/dL (ref 3.8–4.8)
Alkaline Phosphatase: 111 IU/L (ref 44–121)
BUN: 10 mg/dL (ref 6–24)
Bilirubin Total: 0.2 mg/dL (ref 0.0–1.2)
CO2: 21 mmol/L (ref 20–29)
Calcium: 9.2 mg/dL (ref 8.7–10.2)
Chloride: 103 mmol/L (ref 96–106)
Creatinine, Ser: 0.7 mg/dL (ref 0.57–1.00)
GFR calc Af Amer: 124 mL/min/{1.73_m2} (ref >59)
GFR calc non Af Amer: 108 mL/min/{1.73_m2} (ref >59)
Glucose: 90 mg/dL (ref 65–99)
Potassium: 4.1 mmol/L (ref 3.5–5.2)
Sodium: 137 mmol/L (ref 134–144)
Total Protein: 6.8 g/dL (ref 6.0–8.5)

## 2020-09-10 LAB — FSH/LH
FSH: 5.2 m[IU]/mL
LH: 6.1 m[IU]/mL

## 2020-09-10 LAB — VITAMIN D 25 HYDROXY (VIT D DEFICIENCY, FRACTURES): Vit D, 25-Hydroxy: 30.8 ng/mL (ref 30.0–100.0)

## 2020-09-22 ENCOUNTER — Telehealth: Payer: Self-pay | Admitting: Physician Assistant

## 2020-09-22 ENCOUNTER — Telehealth (INDEPENDENT_AMBULATORY_CARE_PROVIDER_SITE_OTHER): Payer: No Typology Code available for payment source | Admitting: Physician Assistant

## 2020-09-22 ENCOUNTER — Encounter: Payer: Self-pay | Admitting: Physician Assistant

## 2020-09-22 DIAGNOSIS — J189 Pneumonia, unspecified organism: Secondary | ICD-10-CM | POA: Diagnosis not present

## 2020-09-22 MED ORDER — AZITHROMYCIN 250 MG PO TABS: ORAL_TABLET | ORAL | 0 refills | Status: DC

## 2020-09-22 MED ORDER — ALBUTEROL SULFATE HFA 108 (90 BASE) MCG/ACT IN AERS: 2.0000 | INHALATION_SPRAY | Freq: Four times a day (QID) | RESPIRATORY_TRACT | 0 refills | Status: DC | PRN

## 2020-09-22 MED ORDER — PSEUDOEPH-BROMPHEN-DM 30-2-10 MG/5ML PO SYRP: 5.0000 mL | ORAL_SOLUTION | Freq: Three times a day (TID) | ORAL | 0 refills | Status: DC | PRN

## 2020-09-22 MED ORDER — PREDNISONE 10 MG (21) PO TBPK: ORAL_TABLET | ORAL | 0 refills | Status: DC

## 2020-09-22 NOTE — Telephone Encounter (Signed)
Yes this is fine.

## 2020-09-22 NOTE — Patient Instructions (Signed)
Community-Acquired Pneumonia, Adult Pneumonia is an infection of the lungs. It causes swelling in the airways of the lungs. Mucus and fluid may also build up inside the airways. One type of pneumonia can happen while a person is in a hospital. A different type can happen when a person is not in a hospital (community-acquired pneumonia).  What are the causes?  This condition is caused by germs (viruses, bacteria, or fungi). Some types of germs can be passed from one person to another. This can happen when you breathe in droplets from the cough or sneeze of an infected person. What increases the risk? You are more likely to develop this condition if you:  Have a long-term (chronic) disease, such as: ? Chronic obstructive pulmonary disease (COPD). ? Asthma. ? Cystic fibrosis. ? Congestive heart failure. ? Diabetes. ? Kidney disease.  Have HIV.  Have sickle cell disease.  Have had your spleen removed.  Do not take good care of your teeth and mouth (poor dental hygiene).  Have a medical condition that increases the risk of breathing in droplets from your own mouth and nose.  Have a weakened body defense system (immune system).  Are a smoker.  Travel to areas where the germs that cause this illness are common.  Are around certain animals or the places they live. What are the signs or symptoms?  A dry cough.  A wet (productive) cough.  Fever.  Sweating.  Chest pain. This often happens when breathing deeply or coughing.  Fast breathing or trouble breathing.  Shortness of breath.  Shaking chills.  Feeling tired (fatigue).  Muscle aches. How is this treated? Treatment for this condition depends on many things. Most adults can be treated at home. In some cases, treatment must happen in a hospital. Treatment may include:  Medicines given by mouth or through an IV tube.  Being given extra oxygen.  Respiratory therapy. In rare cases, treatment for very bad pneumonia  may include:  Using a machine to help you breathe.  Having a procedure to remove fluid from around your lungs. Follow these instructions at home: Medicines  Take over-the-counter and prescription medicines only as told by your doctor. ? Only take cough medicine if you are losing sleep.  If you were prescribed an antibiotic medicine, take it as told by your doctor. Do not stop taking the antibiotic even if you start to feel better. General instructions   Sleep with your head and neck raised (elevated). You can do this by sleeping in a recliner or by putting a few pillows under your head.  Rest as needed. Get at least 8 hours of sleep each night.  Drink enough water to keep your pee (urine) pale yellow.  Eat a healthy diet that includes plenty of vegetables, fruits, whole grains, low-fat dairy products, and lean protein.  Do not use any products that contain nicotine or tobacco. These include cigarettes, e-cigarettes, and chewing tobacco. If you need help quitting, ask your doctor.  Keep all follow-up visits as told by your doctor. This is important. How is this prevented? A shot (vaccine) can help prevent pneumonia. Shots are often suggested for:  People older than 41 years of age.  People older than 41 years of age who: ? Are having cancer treatment. ? Have long-term (chronic) lung disease. ? Have problems with their body's defense system. You may also prevent pneumonia if you take these actions:  Get the flu (influenza) shot every year.  Go to the dentist as   often as told.  Wash your hands often. If you cannot use soap and water, use hand sanitizer. Contact a doctor if:  You have a fever.  You lose sleep because your cough medicine does not help. Get help right away if:  You are short of breath and it gets worse.  You have more chest pain.  Your sickness gets worse. This is very serious if: ? You are an older adult. ? Your body's defense system is weak.  You  cough up blood. Summary  Pneumonia is an infection of the lungs.  Most adults can be treated at home. Some will need treatment in a hospital.  Drink enough water to keep your pee pale yellow.  Get at least 8 hours of sleep each night. This information is not intended to replace advice given to you by your health care provider. Make sure you discuss any questions you have with your health care provider. Document Revised: 01/24/2019 Document Reviewed: 06/01/2018 Elsevier Patient Education  2020 Elsevier Inc.  

## 2020-09-22 NOTE — Progress Notes (Signed)
MyChart Video Visit    Virtual Visit via Video Note   This visit type was conducted due to national recommendations for restrictions regarding the COVID-19 Pandemic (e.g. social distancing) in an effort to limit this patient's exposure and mitigate transmission in our community. This patient is at least at moderate risk for complications without adequate follow up. This format is felt to be most appropriate for this patient at this time. Physical exam was limited by quality of the video and audio technology used for the visit.   Patient location: Home Provider location: [facility]  I discussed the limitations of evaluation and management by telemedicine and the availability of in person appointments. The patient expressed understanding and agreed to proceed.  Patient: Brooke Gordon   DOB: 1980/12/23   39 y.o. Female  MRN: [id number] Visit Date: 09/22/2020  Today's healthcare provider: Mar Daring, PA-C   No chief complaint on file.  Subjective    URI  This is a new problem. The current episode started in the past 7 days (Started on Friday). The problem has been gradually worsening. The maximum temperature recorded prior to her arrival was 101 - 101.9 F. The fever has been present for 1 to 2 days. Associated symptoms include congestion, coughing (dry), headaches, nausea, rhinorrhea, a sore throat and wheezing. Pertinent negatives include no ear pain or sinus pain. She has tried acetaminophen and NSAIDs for the symptoms. The treatment provided mild relief.   Covid test was negative. No fever since Saturday night. No loss of smell or taste. Has had been covid vaccinated, 2nd dose was Oct 15th. Flu vaccine was Oct 29th.   Patient Active Problem List   Diagnosis Date Noted  . Abnormal vaginal Pap smear 07/12/2016  . Anemia 07/12/2016  . Hypothyroidism 07/12/2016  . Thyroid cancer ([place]) 12/08/2015  . Non-atypical endometrial cells on cervical Pap smear 08/06/2014  . Obesity  08/06/2014  . Vitamin D deficiency 08/06/2014  . Hepatic steatosis 05/09/2014   Past Medical History:  Diagnosis Date  . Anemia    pregnancy only  . Complication of anesthesia   . Hypothyroidism    h/o thyroidectomy  . Macrosomia   . Malignant neoplasm of thyroid gland ([place])   . 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 PONV (postoperative nausea and vomiting)       Medications: Outpatient Medications Prior to Visit  Medication Sig  . levothyroxine (SYNTHROID, LEVOTHROID) 150 MCG tablet Take by mouth.   Facility-Administered Medications Prior to Visit  Medication Dose Route Frequency Provider  . bupivacaine (SENSORCAINE-MPF) 0.75 % injection    Anesthesia Intra-op Gunnar Bulla, MD    Review of Systems  Constitutional: Positive for fatigue and fever.  HENT: Positive for congestion, rhinorrhea and sore throat. Negative for ear pain, postnasal drip and sinus pain.   Respiratory: Positive for cough (dry), chest tightness and wheezing. Negative for shortness of breath.   Gastrointestinal: Positive for nausea.  Neurological: Positive for headaches.    Last CBC Lab Results  Component Value Date   WBC 6.5 09/08/2020   HGB 11.3 09/08/2020   HCT 34.1 09/08/2020   MCV 84 09/08/2020   MCH 28.0 09/08/2020   RDW 12.4 09/08/2020   PLT 279 79/39/0300   Last metabolic panel Lab Results  Component Value Date   GLUCOSE 90 09/08/2020   NA 137 09/08/2020   K 4.1 09/08/2020   CL 103 09/08/2020   CO2 21 09/08/2020   BUN 10 09/08/2020   CREATININE  0.70 09/08/2020   GFRNONAA 108 09/08/2020   GFRAA 124 09/08/2020   CALCIUM 9.2 09/08/2020   PHOS 3.7 06/26/2015   PROT 6.8 09/08/2020   ALBUMIN 4.3 09/08/2020   LABGLOB 2.5 09/08/2020   AGRATIO 1.7 09/08/2020   BILITOT 0.2 09/08/2020   ALKPHOS 111 09/08/2020   AST 11 09/08/2020   ALT 13 09/08/2020   ANIONGAP 6 07/11/2016   Last lipids Lab Results  Component Value Date   CHOL 198 09/08/2020   HDL 67 09/08/2020    LDLCALC 114 (H) 09/08/2020   TRIG 93 09/08/2020   CHOLHDL 3.0 09/08/2020      Objective    LMP 08/24/2020  BP Readings from Last 3 Encounters:  09/05/20 138/71  07/25/18 123/79  07/05/18 120/88   Wt Readings from Last 3 Encounters:  09/05/20 255 lb 14.4 oz (116.1 kg)  07/25/18 217 lb 9.6 oz (98.7 kg)  07/05/18 217 lb 9.6 oz (98.7 kg)      Physical Exam Vitals reviewed.  Constitutional:      General: She is not in acute distress.    Appearance: Normal appearance. She is well-developed. She is ill-appearing.  HENT:     Head: Normocephalic and atraumatic.     Nose:     Comments: Very congested sounding; dry, hacky cough heard a few times Pulmonary:     Effort: Pulmonary effort is normal. No respiratory distress.  Musculoskeletal:     Cervical back: Normal range of motion and neck supple.  Neurological:     Mental Status: She is alert.  Psychiatric:        Mood and Affect: Mood normal.        Behavior: Behavior normal.        Thought Content: Thought content normal.        Judgment: Judgment normal.        Assessment & Plan     1. Pneumonia of lower lobe due to infectious organism, unspecified laterality Worsening. Will treat with zpak, prednisone, albuterol and Bromfed DM cough syrup. Push fluids. Rest. Call if worsening.  - azithromycin (ZITHROMAX) 250 MG tablet; Take 2 tablets PO on day one, and one tablet PO daily thereafter until completed.  Dispense: 6 tablet; Refill: 0 - predniSONE (STERAPRED UNI-PAK 21 TAB) 10 MG (21) TBPK tablet; 6 day taper; take as directed on package instructions  Dispense: 21 tablet; Refill: 0 - albuterol (VENTOLIN HFA) 108 (90 Base) MCG/ACT inhaler; Inhale 2 puffs into the lungs every 6 (six) hours as needed for wheezing or shortness of breath.  Dispense: 8 g; Refill: 0 - brompheniramine-pseudoephedrine-DM 30-2-10 MG/5ML syrup; Take 5 mLs by mouth 3 (three) times daily as needed.  Dispense: 120 mL; Refill: 0   No follow-ups on file.      I discussed the assessment and treatment plan with the patient. The patient was provided an opportunity to ask questions and all were answered. The patient agreed with the plan and demonstrated an understanding of the instructions.   The patient was advised to call back or seek an in-person evaluation if the symptoms worsen or if the condition fails to improve as anticipated.  I provided 8 minutes of face-to-face time during this encounter via MyChart Video enabled encounter.  Reynolds Bowl, PA-C, have reviewed all documentation for this visit. The documentation on 09/22/20 for the exam, diagnosis, procedures, and orders are all accurate and complete.  Rubye Beach [facility] [phone] (phone) [phone] (fax)  [facility]

## 2020-09-26 ENCOUNTER — Encounter: Payer: Self-pay | Admitting: Physician Assistant

## 2020-09-26 ENCOUNTER — Ambulatory Visit: Payer: 59 | Admitting: Obstetrics and Gynecology

## 2020-09-26 DIAGNOSIS — R059 Cough, unspecified: Secondary | ICD-10-CM

## 2020-09-26 NOTE — Addendum Note (Signed)
Addended by: Mar Daring on: 09/26/2020 01:53 PM   Modules accepted: Orders

## 2020-09-28 LAB — SARS-COV-2, NAA 2 DAY TAT

## 2020-09-28 LAB — NOVEL CORONAVIRUS, NAA: SARS-CoV-2, NAA: NOT DETECTED

## 2020-10-14 ENCOUNTER — Other Ambulatory Visit: Payer: Self-pay | Admitting: Physician Assistant

## 2020-10-14 DIAGNOSIS — J189 Pneumonia, unspecified organism: Secondary | ICD-10-CM

## 2020-10-15 ENCOUNTER — Other Ambulatory Visit (HOSPITAL_COMMUNITY)
Admission: RE | Admit: 2020-10-15 | Discharge: 2020-10-15 | Disposition: A | Discharge status: Home / Self Care | Payer: No Typology Code available for payment source | Source: Ambulatory Visit | Attending: Obstetrics and Gynecology | Admitting: Obstetrics and Gynecology

## 2020-10-15 ENCOUNTER — Encounter: Payer: Self-pay | Admitting: Obstetrics and Gynecology

## 2020-10-15 ENCOUNTER — Other Ambulatory Visit: Payer: Self-pay

## 2020-10-15 ENCOUNTER — Ambulatory Visit (INDEPENDENT_AMBULATORY_CARE_PROVIDER_SITE_OTHER): Payer: No Typology Code available for payment source | Admitting: Obstetrics and Gynecology

## 2020-10-15 VITALS — BP 148/92 | Ht 66.0 in | Wt 260.0 lb

## 2020-10-15 DIAGNOSIS — E280 Estrogen excess: Secondary | ICD-10-CM

## 2020-10-15 DIAGNOSIS — N939 Abnormal uterine and vaginal bleeding, unspecified: Secondary | ICD-10-CM | POA: Diagnosis not present

## 2020-10-15 DIAGNOSIS — Z124 Encounter for screening for malignant neoplasm of cervix: Secondary | ICD-10-CM

## 2020-10-15 NOTE — Progress Notes (Signed)
Gynecology Abnormal Uterine Bleeding Initial Evaluation   Chief Complaint:  Chief Complaint  Patient presents with  . Menorrhagia    Referral for menorrhagia - RM 5    History of Present Illness:    Paitient is a 39 y.o. [id number] who LMP was Patient's last menstrual period was 10/14/2020., presents today for a problem visit.  She complains of menometrorrhagia that  began in the past 6 months and its severity is described as severe.  The patient menstrual complaints are chronic present for the past 6 months,  The patient is not any any exogenous hormones for contraceptin and relies on prior tubal ligation for contraception. Initially following tubal menses were regular intervals and flow before gradually increasing in flow and intensity with associated dysmenorrhea rather acutely in the past 6 months.    Previous evaluation:  09/08/2020 labs Terry 5.2 LH 6.1 Estrogen 1,848  Prior Diagnosis: N/A.  No recent pelvic imaging since last pregnancy 2018.  The patient past medical history is notable for a history of thyroid cancer status post thyroidectomy and resulting hypothyroidism under the continued care of Dr. Della Goo, endocrinology at [facility].    LMP: Patient's last menstrual period was 10/14/2020. Review of Systems: Review of Systems  Constitutional: Negative.   Gastrointestinal: Negative.   Genitourinary: Negative.   Neurological: Negative for headaches.  Endo/Heme/Allergies: Does not bruise/bleed easily.    Past Medical History:  Patient Active Problem List   Diagnosis Date Noted  . Abnormal vaginal Pap smear 07/12/2016  . Anemia 07/12/2016  . Hypothyroidism 07/12/2016  . Thyroid cancer ([place]) 12/08/2015  . Non-atypical endometrial cells on cervical Pap smear 08/06/2014  . Obesity 08/06/2014  . Vitamin D deficiency 08/06/2014    Overview:  Follow up pending lab results.   . Hepatic steatosis 05/09/2014    Past Surgical History:  Past Surgical History:  Procedure  Laterality Date  . BILATERAL SALPINGECTOMY    . COLPOSCOPY  07/2012   neg  . LAPAROSCOPIC TUBAL LIGATION N/A 02/03/2017   Procedure: LAPAROSCOPIC TUBAL LIGATION via SALPINGECTOMY;  Surgeon: Malachy Mood, MD;  Location: [facility] ORS;  Service: Gynecology;  Laterality: N/A;  . THYROIDECTOMY N/A 06/25/2015   Procedure: Total thyroidectomy with laryngeal nerve monitoring ;  Surgeon: Clyde Canterbury, MD;  Location: [facility] ORS;  Service: ENT;  Laterality: N/A;  . TUBAL LIGATION  02/03/2017  . WISDOM TOOTH EXTRACTION      Obstetric History: [id number]  Family History:  Family History  Problem Relation Age of Onset  . Cancer Other     Social History:  Social History   Socioeconomic History  . Marital status: Married    Spouse name: Not on file  . Number of children: Not on file  . Years of education: Not on file  . Highest education level: Not on file  Occupational History  . Not on file  Tobacco Use  . Smoking status: Former Smoker    Packs/day: 0.25    Years: 10.00    Pack years: 2.50    Types: Cigarettes    Quit date: 06/23/2008    Years since quitting: 12.3  . Smokeless tobacco: Never Used  Vaping Use  . Vaping Use: Never used  Substance and Sexual Activity  . Alcohol use: Yes    Comment: very rare  . Drug use: No  . Sexual activity: Yes    Birth control/protection: Surgical  Other Topics Concern  . Not on file  Social History Narrative  . Not on file  Social Determinants of Health   Financial Resource Strain: Not on file  Food Insecurity: Not on file  Transportation Needs: Not on file  Physical Activity: Not on file  Stress: Not on file  Social Connections: Not on file  Intimate Partner Violence: Not on file    Allergies:  Allergies  Allergen Reactions  . Codeine Other (See Comments)    Childhood reaction  Reaction:  Unknown     Medications: Prior to Admission medications   Medication Sig Start Date End Date Taking? Authorizing Provider  ascorbic acid  (VITAMIN C) 500 MG tablet Take by mouth.   Yes [provider]  Cholecalciferol 25 MCG (1000 UT) capsule Take by mouth.   Yes [provider]  levothyroxine (SYNTHROID, LEVOTHROID) 150 MCG tablet Take by mouth. 03/01/17  Yes [provider]  Multiple Vitamin (MULTIVITAMIN) capsule Take 1 capsule by mouth daily.   Yes [provider]  albuterol (VENTOLIN HFA) 108 (90 Base) MCG/ACT inhaler INHALE 2 PUFFS INTO THE LUNGS EVERY 6 HOURS AS NEEDED FOR WHEEZING OR SHORTNESS OF BREATH 10/14/20   Margaretann Loveless, PA-C  azithromycin (ZITHROMAX) 250 MG tablet Take 2 tablets PO on day one, and one tablet PO daily thereafter until completed. 09/22/20   Margaretann Loveless, PA-C  brompheniramine-pseudoephedrine-DM 30-2-10 MG/5ML syrup Take 5 mLs by mouth 3 (three) times daily as needed. 09/22/20   Margaretann Loveless, PA-C  predniSONE (STERAPRED UNI-PAK 21 TAB) 10 MG (21) TBPK tablet 6 day taper; take as directed on package instructions 09/22/20   Margaretann Loveless, PA-C    Physical Exam Blood pressure (!) 148/92, height 5\' 6"  (1.676 m), weight 260 lb (117.9 kg), last menstrual period 10/14/2020, currently breastfeeding.  Patient's last menstrual period was 10/14/2020.  General: NAD HEENT: normocephalic, anicteric Thyroid: no enlargement, no palpable nodules Pulmonary: No increased work of breathing Cardiovascular: RRR, distal pulses 2+ Abdomen: NABS, soft, non-tender, non-distended.  Umbilicus without lesions.  No hepatomegaly, splenomegaly or masses palpable. No evidence of hernia  Genitourinary:  External: Normal external female genitalia.  Normal urethral meatus, normal Bartholin's and Skene's glands.    Vagina: Normal vaginal mucosa, no evidence of prolapse.    Cervix: Grossly normal in appearance, no bleeding  Uterus: Non-enlarged, mobile, normal contour.  No CMT  Adnexa: ovaries non-enlarged, no adnexal masses  Rectal: deferred  Lymphatic: no evidence of  inguinal lymphadenopathy Extremities: no edema, erythema, or tenderness Neurologic: Grossly intact Psychiatric: mood appropriate, affect full  Female chaperone present for pelvic portions of the physical exam  Assessment: 39 y.o. 44 with abnormal uterine bleeding  Plan: Problem List Items Addressed This Visit   None   Visit Diagnoses    Hyperestrogenism    -  Primary   Relevant Orders   Inhibin B   Prolactin   Estradiol   Screening for malignant neoplasm of cervix       Relevant Orders   Cytology - PAP   Abnormal uterine bleeding       Relevant Orders   Inhibin B   [id number] Transvaginal Non-OB   Prolactin      1) Discussed management options for abnormal uterine bleeding including expectant, NSAIDs, tranexamic acid (Lysteda), oral progesterone (Provera, norethindrone, megace), Depo Provera, Levonorgestrel containing IUD, endometrial ablation (Novasure) or hysterectomy as definitive surgical management.  Discussed risks and benefits of each method.   Final management decision will hinge on results of patient's work up and whether an underlying etiology for the patients bleeding symptoms can be  discerned.  We will conduct a basic work up examining using the PALM-COIEN classification system.   The role of unopposed estrogen in the development of endometrial hyperplasia or carcinoma is discussed.  The risk of endometrial hyperplasia is linearly correlated with increasing BMI given the production of estrone by adipose tissue. Printed patient education handouts were given to the patient to review at home.  Bleeding precautions reviewed.  - Work up to date notable for unusually high elevation is serum estradiol.  We discussed that there are two main sources of estrogen in the body with the major producer being the ovaries which produce estradiol and to a lesser extend adipose tissue which produced overall less potent estrione.  My main concern that I would like to rule out is an estrogen  producing neoplasm such as an granulosa cell tumor.  Will will evaluate for ovarian masses in concurrence with our work up for her AUB.  In addition an serum inhibin level was obtained at today's visit and we will repeat an estradiol level  2) A total of 15 minutes were spent in face-to-face contact with the patient during this encounter with over half of that time devoted to counseling and coordination of care.  3) Return in about 1 week (around 10/22/2020) for TVUS and follow up.   Malachy Mood, MD, FACOG  TVUS Repeat estradiol elvel Inhibin B Prolactin Pap brought up to date   Malachy Mood, MD, [place] 10/15/2020, 3:46 PM

## 2020-10-16 LAB — PROLACTIN: Prolactin: 14.3 ng/mL (ref 4.8–23.3)

## 2020-10-16 LAB — ESTRADIOL: Estradiol: 43.2 pg/mL

## 2020-10-16 LAB — INHIBIN B: Inhibin B: 81.7 pg/mL

## 2020-10-20 ENCOUNTER — Telehealth (INDEPENDENT_AMBULATORY_CARE_PROVIDER_SITE_OTHER): Payer: No Typology Code available for payment source | Admitting: Physician Assistant

## 2020-10-20 ENCOUNTER — Other Ambulatory Visit: Payer: Self-pay | Admitting: Physician Assistant

## 2020-10-20 ENCOUNTER — Encounter: Payer: Self-pay | Admitting: Physician Assistant

## 2020-10-20 DIAGNOSIS — J45909 Unspecified asthma, uncomplicated: Secondary | ICD-10-CM | POA: Diagnosis not present

## 2020-10-20 LAB — CYTOLOGY - PAP
Comment: NEGATIVE
Diagnosis: NEGATIVE
High risk HPV: NEGATIVE

## 2020-10-20 MED ORDER — PREDNISONE 10 MG PO TABS: ORAL_TABLET | ORAL | 0 refills | Status: DC

## 2020-10-20 MED ORDER — FLUTICASONE FUROATE-VILANTEROL 100-25 MCG/INH IN AEPB: 1.0000 | INHALATION_SPRAY | Freq: Every day | RESPIRATORY_TRACT | Status: DC

## 2020-10-20 MED ORDER — PROMETHAZINE-DM 6.25-15 MG/5ML PO SYRP: 5.0000 mL | ORAL_SOLUTION | Freq: Four times a day (QID) | ORAL | 0 refills | Status: DC | PRN

## 2020-10-20 MED ORDER — BREO ELLIPTA 100-25 MCG/INH IN AEPB: 1.0000 | INHALATION_SPRAY | Freq: Every day | RESPIRATORY_TRACT | 1 refills | Status: DC

## 2020-10-20 NOTE — Progress Notes (Signed)
MyChart Video Visit    Virtual Visit via Video Note   This visit type was conducted due to national recommendations for restrictions regarding the COVID-19 Pandemic (e.g. social distancing) in an effort to limit this patient's exposure and mitigate transmission in our community. This patient is at least at moderate risk for complications without adequate follow up. This format is felt to be most appropriate for this patient at this time. Physical exam was limited by quality of the video and audio technology used for the visit.   Patient location: Home Provider location: office   I discussed the limitations of evaluation and management by telemedicine and the availability of in person appointments. The patient expressed understanding and agreed to proceed.  Patient: Brooke Gordon   DOB: December 31, 1980   40 y.o. Female  MRN: [id number] Visit Date: 10/20/2020  Today's healthcare provider: Trey Sailors, PA-C   Chief Complaint  Patient presents with  . Shortness of Breath   Subjective    HPI  Upper Respiratory Infection: Patient complains of symptoms of a URI. Symptoms include congestion and cough. Onset of symptoms was 3 days ago, unchanged since that time. She also c/o no  fever, non productive cough, shortness of breath and wheezing for the past 3 days .  She is drinking plenty of fluids. Evaluation to date: none. Treatment to date: cough syrup.   Pt c/o of cough, congestion, tightness in chest, wheezing, dry cough, sob x 3 days Pt reports similar episode in early December.     Medications: Outpatient Medications Prior to Visit  Medication Sig  . ascorbic acid (VITAMIN C) 500 MG tablet Take by mouth.  Marland Kitchen azithromycin (ZITHROMAX) 250 MG tablet Take 2 tablets PO on day one, and one tablet PO daily thereafter until completed.  . brompheniramine-pseudoephedrine-DM 30-2-10 MG/5ML syrup Take 5 mLs by mouth 3 (three) times daily as needed.  . Cholecalciferol 25 MCG (1000 UT)  capsule Take by mouth.  . levothyroxine (SYNTHROID, LEVOTHROID) 150 MCG tablet Take by mouth.  . Multiple Vitamin (MULTIVITAMIN) capsule Take 1 capsule by mouth daily.  . [DISCONTINUED] albuterol (VENTOLIN HFA) 108 (90 Base) MCG/ACT inhaler INHALE 2 PUFFS INTO THE LUNGS EVERY 6 HOURS AS NEEDED FOR WHEEZING OR SHORTNESS OF BREATH  . [DISCONTINUED] predniSONE (STERAPRED UNI-PAK 21 TAB) 10 MG (21) TBPK tablet 6 day taper; take as directed on package instructions   Facility-Administered Medications Prior to Visit  Medication Dose Route Frequency Provider  . bupivacaine (SENSORCAINE-MPF) 0.75 % injection    Anesthesia Intra-op Berdine Addison, MD    Review of Systems  Constitutional: Negative for chills, fatigue and fever.  Respiratory: Positive for cough, chest tightness, shortness of breath and wheezing. Negative for apnea, choking and stridor.       Objective    LMP 10/14/2020    Physical Exam Constitutional:      Appearance: Normal appearance.  Pulmonary:     Effort: Pulmonary effort is normal. No respiratory distress.  Neurological:     Mental Status: She is alert.  Psychiatric:        Mood and Affect: Mood normal.        Behavior: Behavior normal.        Assessment & Plan    1. Reactive airway disease without complication, unspecified asthma severity, unspecified whether persistent  Treat as below. Start controller inhaler.   - fluticasone furoate-vilanterol (BREO ELLIPTA) 100-25 MCG/INH AEPB; Inhale 1 puff into the lungs daily.  Dispense: 28 each; Refill: 1 -  predniSONE (DELTASONE) 10 MG tablet; Take 6 pills on day 1, 5 pills on day 2 and so on until complete. (Patient not taking: Reported on 10/27/2020)  Dispense: 21 tablet; Refill: 0   Return if symptoms worsen or fail to improve.     I discussed the assessment and treatment plan with the patient. The patient was provided an opportunity to ask questions and all were answered. The patient agreed with the plan and  demonstrated an understanding of the instructions.   The patient was advised to call back or seek an in-person evaluation if the symptoms worsen or if the condition fails to improve as anticipated.   ITrinna Post, PA-C, have reviewed all documentation for this visit. The documentation on 10/29/20 for the exam, diagnosis, procedures, and orders are all accurate and complete.  The entirety of the information documented in the History of Present Illness, Review of Systems and Physical Exam were personally obtained by me. Portions of this information were initially documented by [facility] and reviewed by me for thoroughness and accuracy.    Paulene Floor [facility] [phone] (phone) [phone] (fax)  [place]

## 2020-10-21 ENCOUNTER — Telehealth: Payer: Self-pay | Admitting: Physician Assistant

## 2020-10-21 DIAGNOSIS — J45909 Unspecified asthma, uncomplicated: Secondary | ICD-10-CM

## 2020-10-21 MED ORDER — ALBUTEROL SULFATE HFA 108 (90 BASE) MCG/ACT IN AERS: 2.0000 | INHALATION_SPRAY | Freq: Four times a day (QID) | RESPIRATORY_TRACT | 2 refills | Status: DC | PRN

## 2020-10-21 MED ORDER — PREDNISONE 10 MG PO TABS: ORAL_TABLET | ORAL | 0 refills | Status: DC

## 2020-10-21 NOTE — Telephone Encounter (Signed)
Resent prednisone taper, should be 21 tabs. Sent to [facility], please let patient know.

## 2020-10-27 ENCOUNTER — Other Ambulatory Visit: Payer: Self-pay | Admitting: Obstetrics and Gynecology

## 2020-10-27 ENCOUNTER — Ambulatory Visit (INDEPENDENT_AMBULATORY_CARE_PROVIDER_SITE_OTHER): Payer: No Typology Code available for payment source | Admitting: Obstetrics and Gynecology

## 2020-10-27 ENCOUNTER — Other Ambulatory Visit: Payer: Self-pay

## 2020-10-27 ENCOUNTER — Other Ambulatory Visit (HOSPITAL_COMMUNITY)
Admission: RE | Admit: 2020-10-27 | Discharge: 2020-10-27 | Disposition: A | Discharge status: Home / Self Care | Payer: No Typology Code available for payment source | Source: Ambulatory Visit | Attending: Obstetrics and Gynecology | Admitting: Obstetrics and Gynecology

## 2020-10-27 ENCOUNTER — Encounter: Payer: Self-pay | Admitting: Obstetrics and Gynecology

## 2020-10-27 ENCOUNTER — Ambulatory Visit (INDEPENDENT_AMBULATORY_CARE_PROVIDER_SITE_OTHER): Payer: No Typology Code available for payment source

## 2020-10-27 VITALS — BP 138/92 | Ht 66.0 in | Wt 259.0 lb

## 2020-10-27 DIAGNOSIS — R9389 Abnormal findings on diagnostic imaging of other specified body structures: Secondary | ICD-10-CM | POA: Diagnosis not present

## 2020-10-27 DIAGNOSIS — N939 Abnormal uterine and vaginal bleeding, unspecified: Secondary | ICD-10-CM

## 2020-10-27 NOTE — Progress Notes (Signed)
Gynecology Ultrasound Follow Up  Chief Complaint:  Chief Complaint  Patient presents with  . Follow-up    TVUS - RM 4     History of Present Illness: Patient is a 40 y.o. female who presents today for ultrasound evaluation of AUB.  Ultrasound demonstrates the following findgins Adnexa: no masses seen  Uterus: Non-enlarged with endometrial stripe 49mm.  There is one area within the endometrium which appears suggestive of an anterior polyp.  The endometrial myometrial interface is indistinct and there are some subendometrial cysts suggestive of adenomyosis.   Additional: no free fluid  Review of Systems: Review of Systems  Constitutional: Negative.   Gastrointestinal: Negative.   Genitourinary: Negative.     Past Medical History:  Past Medical History:  Diagnosis Date  . Anemia    pregnancy only  . Complication of anesthesia   . Hypothyroidism    h/o thyroidectomy  . Macrosomia   . Malignant neoplasm of thyroid gland ([place])   . 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 PONV (postoperative nausea and vomiting)     Past Surgical History:  Past Surgical History:  Procedure Laterality Date  . BILATERAL SALPINGECTOMY    . COLPOSCOPY  07/2012   neg  . LAPAROSCOPIC TUBAL LIGATION N/A 02/03/2017   Procedure: LAPAROSCOPIC TUBAL LIGATION via SALPINGECTOMY;  Surgeon: Malachy Mood, MD;  Location: [facility] ORS;  Service: Gynecology;  Laterality: N/A;  . THYROIDECTOMY N/A 06/25/2015   Procedure: Total thyroidectomy with laryngeal nerve monitoring ;  Surgeon: Clyde Canterbury, MD;  Location: [facility] ORS;  Service: ENT;  Laterality: N/A;  . TUBAL LIGATION  02/03/2017  . WISDOM TOOTH EXTRACTION      Gynecologic History:  Patient's last menstrual period was 10/14/2020. Contraception: tubal ligation Last Pap: 10/15/2020. Results were: .NILM HPV negative  Family History:  Family History  Problem Relation Age of Onset  . Cancer Other     Social History:  Social History    Socioeconomic History  . Marital status: Married    Spouse name: Not on file  . Number of children: Not on file  . Years of education: Not on file  . Highest education level: Not on file  Occupational History  . Not on file  Tobacco Use  . Smoking status: Former Smoker    Packs/day: 0.25    Years: 10.00    Pack years: 2.50    Types: Cigarettes    Quit date: 06/23/2008    Years since quitting: 12.3  . Smokeless tobacco: Never Used  Vaping Use  . Vaping Use: Never used  Substance and Sexual Activity  . Alcohol use: Yes    Comment: very rare  . Drug use: No  . Sexual activity: Yes    Birth control/protection: Surgical  Other Topics Concern  . Not on file  Social History Narrative  . Not on file   Social Determinants of Health   Financial Resource Strain: Not on file  Food Insecurity: Not on file  Transportation Needs: Not on file  Physical Activity: Not on file  Stress: Not on file  Social Connections: Not on file  Intimate Partner Violence: Not on file    Allergies:  Allergies  Allergen Reactions  . Codeine Other (See Comments)    Childhood reaction  Reaction:  Unknown     Medications: Prior to Admission medications   Medication Sig Start Date End Date Taking? Authorizing Provider  albuterol (VENTOLIN HFA) 108 (90 Base) MCG/ACT inhaler Inhale 2 puffs into the  lungs every 6 (six) hours as needed for wheezing or shortness of breath. 10/21/20  Yes Trinna Post, PA-C  ascorbic acid (VITAMIN C) 500 MG tablet Take by mouth.   Yes [provider]  Cholecalciferol 25 MCG (1000 UT) capsule Take by mouth.   Yes [provider]  fluticasone furoate-vilanterol (BREO ELLIPTA) 100-25 MCG/INH AEPB Inhale 1 puff into the lungs daily. 10/20/20  Yes Trinna Post, PA-C  levothyroxine (SYNTHROID, LEVOTHROID) 150 MCG tablet Take by mouth. 03/01/17  Yes [provider]  Multiple Vitamin (MULTIVITAMIN) capsule Take 1 capsule by mouth daily.   Yes  [provider]  promethazine-dextromethorphan (PROMETHAZINE-DM) 6.25-15 MG/5ML syrup Take 5 mLs by mouth 4 (four) times daily as needed for cough. 10/20/20  Yes Trinna Post, PA-C  azithromycin (ZITHROMAX) 250 MG tablet Take 2 tablets PO on day one, and one tablet PO daily thereafter until completed. 09/22/20   Mar Daring, PA-C  brompheniramine-pseudoephedrine-DM 30-2-10 MG/5ML syrup Take 5 mLs by mouth 3 (three) times daily as needed. 09/22/20   Mar Daring, PA-C  predniSONE (DELTASONE) 10 MG tablet Take 6 pills on day 1, 5 pills on day 2 and so on until complete. Patient not taking: Reported on 10/27/2020 10/21/20   Trinna Post, PA-C    Physical Exam Vitals: Blood pressure (!) 138/92, height 5\' 6"  (1.676 m), weight 259 lb (117.5 kg), last menstrual period 10/14/2020, currently breastfeeding.  General: NAD HEENT: normocephalic, anicteric Pulmonary: No increased work of breathing Extremities: no edema, erythema, or tenderness Neurologic: Grossly intact, normal gait Psychiatric: mood appropriate, affect full  US PELVIC COMPLETE WITH TRANSVAGINAL  Result Date: 10/27/2020 Patient Name: Brooke Gordon DOB: 10-17-1981 MRN: [id number] ULTRASOUND REPORT Location: [facility] Date of Service: 10/27/2020 Indications:Abnormal Uterine Bleeding Findings: The uterus is anteverted and measures 12.1 x 6.2 x 5.4 cm. Echo texture is homogenous without evidence of focal masses. The Endometrium measures 18.4 mm. Right Ovary measures 4.1 x 2.0 x 3.1 cm. It is normal in appearance. Left Ovary measures 4.9 x 2.6 x 2.2 cm. It is normal in appearance. Survey of the adnexa demonstrates no adnexal masses. There is no free fluid in the cul de sac. Impression: 1. Normal pelvic ultrasound. Recommendations: 1.Clinical correlation with the patient's History and Physical Exam. Gweneth Dimitri, RT Images reviewed.  Normal GYN study other than slightly thickened endometrium.  There is at least  one area that appear slightly suggestive of possible endometrial polyp.  The myometrial endometrial interface appears indistinct in some images suggestive of possible adenomyosis as well.  Malachy Mood, MD, [facility], [facility] 10/27/2020, 1:38 PM    ENDOMETRIAL BIOPSY     The indications for endometrial biopsy were reviewed.   Risks of the biopsy including cramping, bleeding, infection, uterine perforation, inadequate specimen and need for additional procedures  were discussed. The patient states she understands and agrees to undergo procedure today. Consent was signed. Time out was performed. Urine HCG was negative. A Graves speculum was placed and the cervix was brought into view.  The cervix was prepped with Betadine. A single-toothed tenaculum was not placed on the anterior lip of the cervix for traction. A 3 mm pipelle was introduced through the cervix into the endometrial cavity without difficulty to a depth of 8cm, and a small amount of tissue was obtained, the resulting specime sent to pathology. The instruments were removed from the patient's vagina. Minimal bleeding from the cervix was noted. The patient  tolerated the procedure well. Routine post-procedure instructions were given to the patient.  She will be contacted by phone one results become available.     Assessment: 40 y.o. [id number] follow up AUB  Plan: Problem List Items Addressed This Visit   None     1) AUB  - labs normal thus far with normal repeat estradiol level, negative inhibin - no adnexal masses - Uterine lining slightly thickened with concern for possible polyp as well as some features suggestive of adenomyosis - Endometrial biopsy obtained today - Based on results of endometrial biopsy we discussed if presence of polyp confirmed proceed with hysteroscopy and polypectomy.  If negative for endometrial polyp discussed progestin only option for cycle control/supression  2) A total of 15  minutes were spent in face-to-face contact with the patient during this encounter with over half of that time devoted to counseling and coordination of care.  3) Return will call with biopsy results for next steps.    Malachy Mood, MD, [facility], [facility] 10/27/2020, 2:08 PM

## 2020-10-29 LAB — SURGICAL PATHOLOGY

## 2020-11-12 ENCOUNTER — Telehealth: Payer: Self-pay

## 2020-11-12 NOTE — Telephone Encounter (Signed)
Left a message for the patient to return the call.  

## 2020-11-13 ENCOUNTER — Telehealth: Payer: Self-pay

## 2020-11-13 NOTE — Telephone Encounter (Signed)
Patient rtn'd call to schedule Hysteroscopy D&C, IUD insertion w Ste[place]er  DOS 12/18/20  H&P 2/21 @ 9:50   Covid testing 3/2 @ 8-10:30, [facility] [employer], drive up and wear mask. Advised pt to quarantine until DOS.  Pre-admit phone call appointment to be requested - date and time will be included on H&P paper work. Also all appointments will be updated on pt MyChart. Explained that this appointment has a call window. Based on the time scheduled will indicate if the call will be received within a 4 hour window before 1:00 or after.  Advised that pt may also receive calls from the hospital pharmacy and pre-service center.  Confirmed pt has [employer] as Chartered certified accountant. No secondary insurance.  Vena Rua has confirmed that he will be available with [facility].

## 2020-11-13 NOTE — Telephone Encounter (Signed)
-----   Message from Malachy Mood, MD sent at 10/31/2020  5:44 PM EST ----- Regarding: Surgery request Surgery Booking Request Patient Full Name:  Brooke Gordon  MRN: [id number]  DOB: 1980/12/20  Surgeon: Malachy Mood, MD  Requested Surgery Date and Time: 2-12 weeks Primary Diagnosis AND Code: AUB N93.9 Secondary Diagnosis and Code: Endometrial polyp N84.0, Abnormal endometrial ultrasound R93.5 Surgical Procedure: Hysteroscopy D&C, Mirena IUD insertion RNFA Requested?: No L&D Notification: No Admission Status: same day surgery Length of Surgery: 50 min Special Case Needs: No H&P: Yes Phone Interview???:  Yes Interpreter: No Medical Clearance:  No Special Scheduling Instructions: No Any known health/anesthesia issues, diabetes, sleep apnea, latex allergy, defibrillator/pacemaker?: No Acuity: P3   (P1 highest, P2 delay may cause harm, P3 low, elective gyn, P4 lowest)

## 2020-11-19 ENCOUNTER — Other Ambulatory Visit: Payer: No Typology Code available for payment source

## 2020-12-08 ENCOUNTER — Encounter: Payer: Self-pay | Admitting: Obstetrics and Gynecology

## 2020-12-08 ENCOUNTER — Other Ambulatory Visit: Payer: Self-pay

## 2020-12-08 ENCOUNTER — Ambulatory Visit (INDEPENDENT_AMBULATORY_CARE_PROVIDER_SITE_OTHER): Payer: No Typology Code available for payment source | Admitting: Obstetrics and Gynecology

## 2020-12-08 VITALS — BP 132/78 | Ht 66.0 in | Wt 260.0 lb

## 2020-12-08 DIAGNOSIS — Z01818 Encounter for other preprocedural examination: Secondary | ICD-10-CM | POA: Diagnosis not present

## 2020-12-08 DIAGNOSIS — N939 Abnormal uterine and vaginal bleeding, unspecified: Secondary | ICD-10-CM

## 2020-12-08 NOTE — Progress Notes (Signed)
Obstetrics & Gynecology Surgery H&P    Chief Complaint: Scheduled Surgery   History of Present Illness: Patient is a 40 y.o. [id number] presenting for scheduled hysteroscopy, D&C, and endometrial ablation for the treatment or further evaluation of abnormal uterine bleeding.   Prior Treatments prior to proceeding with surgery include: discussion of alternative option including hormonal  Preoperative Pap: 10/15/2020 Results: NILM HPV negative  Preoperative Endometrial biopsy: 10/27/2020 proliferative endometrium with benign endometrial polyp fragments Preoperative Ultrasound: 10/27/2020 Findings: Normal ultrasound but thickened endometrium at 51mm   Review of Systems:10 point review of systems  Past Medical History:  Patient Active Problem List   Diagnosis Date Noted  . Abnormal vaginal Pap smear 07/12/2016  . Anemia 07/12/2016  . Hypothyroidism 07/12/2016  . Thyroid cancer ([place]) 12/08/2015  . Non-atypical endometrial cells on cervical Pap smear 08/06/2014  . Obesity 08/06/2014  . Vitamin D deficiency 08/06/2014    Overview:  Follow up pending lab results.   . Hepatic steatosis 05/09/2014    Past Surgical History:  Past Surgical History:  Procedure Laterality Date  . BILATERAL SALPINGECTOMY    . COLPOSCOPY  07/2012   neg  . LAPAROSCOPIC TUBAL LIGATION N/A 02/03/2017   Procedure: LAPAROSCOPIC TUBAL LIGATION via SALPINGECTOMY;  Surgeon: Malachy Mood, MD;  Location: [facility] ORS;  Service: Gynecology;  Laterality: N/A;  . THYROIDECTOMY N/A 06/25/2015   Procedure: Total thyroidectomy with laryngeal nerve monitoring ;  Surgeon: Clyde Canterbury, MD;  Location: [facility] ORS;  Service: ENT;  Laterality: N/A;  . TUBAL LIGATION  02/03/2017  . WISDOM TOOTH EXTRACTION      Family History:  Family History  Problem Relation Age of Onset  . Cancer Other     Social History:  Social History   Socioeconomic History  . Marital status: Married    Spouse name: Not on file  . Number of  children: Not on file  . Years of education: Not on file  . Highest education level: Not on file  Occupational History  . Not on file  Tobacco Use  . Smoking status: Former Smoker    Packs/day: 0.25    Years: 10.00    Pack years: 2.50    Types: Cigarettes    Quit date: 06/23/2008    Years since quitting: 12.4  . Smokeless tobacco: Never Used  Vaping Use  . Vaping Use: Never used  Substance and Sexual Activity  . Alcohol use: Yes    Comment: very rare  . Drug use: No  . Sexual activity: Yes    Birth control/protection: Surgical  Other Topics Concern  . Not on file  Social History Narrative  . Not on file   Social Determinants of Health   Financial Resource Strain: Not on file  Food Insecurity: Not on file  Transportation Needs: Not on file  Physical Activity: Not on file  Stress: Not on file  Social Connections: Not on file  Intimate Partner Violence: Not on file    Allergies:  Allergies  Allergen Reactions  . Codeine Other (See Comments)    Childhood reaction  Reaction:  Unknown     Medications: Prior to Admission medications   Medication Sig Start Date End Date Taking? Authorizing Provider  ascorbic acid (VITAMIN C) 500 MG tablet Take 500 mg by mouth daily.   Yes [provider]  Cholecalciferol 25 MCG (1000 UT) capsule Take 1,000 Units by mouth daily.   Yes [provider]  levothyroxine (SYNTHROID, LEVOTHROID) 150 MCG tablet Take 150 mcg by  mouth daily before breakfast. 03/01/17  Yes [provider]  Multiple Vitamin (MULTIVITAMIN) capsule Take 1 capsule by mouth daily.   Yes [provider]  zinc gluconate 50 MG tablet Take 50 mg by mouth daily.   Yes [provider]  albuterol (VENTOLIN HFA) 108 (90 Base) MCG/ACT inhaler Inhale 2 puffs into the lungs every 6 (six) hours as needed for wheezing or shortness of breath. Patient not taking: Reported on 12/02/2020 10/21/20   Trinna Post, PA-C  fluticasone  furoate-vilanterol (BREO ELLIPTA) 100-25 MCG/INH AEPB Inhale 1 puff into the lungs daily. Patient not taking: Reported on 12/02/2020 10/20/20   Trinna Post, PA-C  promethazine-dextromethorphan (PROMETHAZINE-DM) 6.25-15 MG/5ML syrup Take 5 mLs by mouth 4 (four) times daily as needed for cough. Patient not taking: Reported on 12/02/2020 10/20/20   Trinna Post, PA-C    Physical Exam Vitals: Blood pressure 132/78, height 5\' 6"  (1.676 m), weight 260 lb (117.9 kg), last menstrual period 12/05/2020, currently breastfeeding.  General: NAD HEENT: normocephalic, anicteric Pulmonary: No increased work of breathing Cardiovascular: RRR, distal pulses 2+ Extremities: no edema, erythema, or tenderness Neurologic: Grossly intact Psychiatric: mood appropriate, affect full  Imaging No results found.  Assessment: 40 y.o. [id number] presenting for scheduled hysteroscopy, D&C, endometrial ablation  Plan: 1) The patient was counseled on the overall effectiveness of endometrial ablation in achieving amenorrhea.  She is aware that some patient may continue to have menstrual cycles although these are generally greatly reduced in flow.  In addition she was quoted a failure rate for endometrial ablation of approximately 25% within the frist 4 years, but these failures may happen at any time during or after the initial 4 year postop period.  She is aware that pregnancy is contra-indicated in the setting of prior endometrial ablation, and that ablation itself does not confer any contraceptive benefit.  She will therefore need to continue to rely on some means of contraception following the procedure.  Although rare and generally confined to patient who have undergone prior tubal ligatoin, post-ablation tubal sterilizaton syndrome (PATSS) may also occur with no reliable incidence rates as the majority of published literature is limited to case reports.   Prior to being considered a candidate for Novasure ablation she  will need to undergo endometrial biopsy to rule out endometrial hyperplasia or malignancy as the cause of her bleeding, have an up to date pap on record, and undergo transvaginal ultrasound to verify the absence of focal endometrial lesion which may need to be addressed prior to proceeding with ablation.  In addition she is aware that the device is limited for use in women with a normal uterine cavity and uterine leiomyomata <3cm in size.  If present leiomyomata may increase the long-term failure rate of the procedure.  In rare instances the presence of a uterine septum or arcuate uterus, which may not be readily apparent on preoperative ultrasound, may necessitate the procedure to be aborted.     2) Routine postoperative instructions were reviewed with the patient and her family in detail today including the expected length of recovery and likely postoperative course.  The patient concurred with the proposed plan, giving informed written consent for the surgery today.  Patient instructed on the importance of being NPO after midnight prior to her procedure.  If warranted preoperative prophylactic antibiotics and SCDs ordered on call to the OR to meet SCIP guidelines and adhere to recommendation laid forth in [place] Number 104 May 2009  "Antibiotic Prophylaxis  for Gynecologic Procedures".     Malachy Mood, MD, [facility], [facility] 12/08/2020, 10:22 AM

## 2020-12-08 NOTE — H&P (View-Only) (Signed)
Obstetrics & Gynecology Surgery H&P    Chief Complaint: Scheduled Surgery   History of Present Illness: Patient is a 40 y.o. [id number] presenting for scheduled hysteroscopy, D&C, and endometrial ablation for the treatment or further evaluation of abnormal uterine bleeding.   Prior Treatments prior to proceeding with surgery include: discussion of alternative option including hormonal  Preoperative Pap: 10/15/2020 Results: NILM HPV negative  Preoperative Endometrial biopsy: 10/27/2020 proliferative endometrium with benign endometrial polyp fragments Preoperative Ultrasound: 10/27/2020 Findings: Normal ultrasound but thickened endometrium at 89mm   Review of Systems:10 point review of systems  Past Medical History:  Patient Active Problem List   Diagnosis Date Noted  . Abnormal vaginal Pap smear 07/12/2016  . Anemia 07/12/2016  . Hypothyroidism 07/12/2016  . Thyroid cancer ([place]) 12/08/2015  . Non-atypical endometrial cells on cervical Pap smear 08/06/2014  . Obesity 08/06/2014  . Vitamin D deficiency 08/06/2014    Overview:  Follow up pending lab results.   . Hepatic steatosis 05/09/2014    Past Surgical History:  Past Surgical History:  Procedure Laterality Date  . BILATERAL SALPINGECTOMY    . COLPOSCOPY  07/2012   neg  . LAPAROSCOPIC TUBAL LIGATION N/A 02/03/2017   Procedure: LAPAROSCOPIC TUBAL LIGATION via SALPINGECTOMY;  Surgeon: Malachy Mood, MD;  Location: [facility] ORS;  Service: Gynecology;  Laterality: N/A;  . THYROIDECTOMY N/A 06/25/2015   Procedure: Total thyroidectomy with laryngeal nerve monitoring ;  Surgeon: Clyde Canterbury, MD;  Location: [facility] ORS;  Service: ENT;  Laterality: N/A;  . TUBAL LIGATION  02/03/2017  . WISDOM TOOTH EXTRACTION      Family History:  Family History  Problem Relation Age of Onset  . Cancer Other     Social History:  Social History   Socioeconomic History  . Marital status: Married    Spouse name: Not on file  . Number of  children: Not on file  . Years of education: Not on file  . Highest education level: Not on file  Occupational History  . Not on file  Tobacco Use  . Smoking status: Former Smoker    Packs/day: 0.25    Years: 10.00    Pack years: 2.50    Types: Cigarettes    Quit date: 06/23/2008    Years since quitting: 12.4  . Smokeless tobacco: Never Used  Vaping Use  . Vaping Use: Never used  Substance and Sexual Activity  . Alcohol use: Yes    Comment: very rare  . Drug use: No  . Sexual activity: Yes    Birth control/protection: Surgical  Other Topics Concern  . Not on file  Social History Narrative  . Not on file   Social Determinants of Health   Financial Resource Strain: Not on file  Food Insecurity: Not on file  Transportation Needs: Not on file  Physical Activity: Not on file  Stress: Not on file  Social Connections: Not on file  Intimate Partner Violence: Not on file    Allergies:  Allergies  Allergen Reactions  . Codeine Other (See Comments)    Childhood reaction  Reaction:  Unknown     Medications: Prior to Admission medications   Medication Sig Start Date End Date Taking? Authorizing Provider  ascorbic acid (VITAMIN C) 500 MG tablet Take 500 mg by mouth daily.   Yes [provider]  Cholecalciferol 25 MCG (1000 UT) capsule Take 1,000 Units by mouth daily.   Yes [provider]  levothyroxine (SYNTHROID, LEVOTHROID) 150 MCG tablet Take 150 mcg by  mouth daily before breakfast. 03/01/17  Yes [provider]  Multiple Vitamin (MULTIVITAMIN) capsule Take 1 capsule by mouth daily.   Yes [provider]  zinc gluconate 50 MG tablet Take 50 mg by mouth daily.   Yes [provider]  albuterol (VENTOLIN HFA) 108 (90 Base) MCG/ACT inhaler Inhale 2 puffs into the lungs every 6 (six) hours as needed for wheezing or shortness of breath. Patient not taking: Reported on 12/02/2020 10/21/20   Pollak, Adriana M, PA-C  fluticasone  furoate-vilanterol (BREO ELLIPTA) 100-25 MCG/INH AEPB Inhale 1 puff into the lungs daily. Patient not taking: Reported on 12/02/2020 10/20/20   Pollak, Adriana M, PA-C  promethazine-dextromethorphan (PROMETHAZINE-DM) 6.25-15 MG/5ML syrup Take 5 mLs by mouth 4 (four) times daily as needed for cough. Patient not taking: Reported on 12/02/2020 10/20/20   Pollak, Adriana M, PA-C    Physical Exam Vitals: Blood pressure 132/78, height 5' 6" (1.676 m), weight 260 lb (117.9 kg), last menstrual period 12/05/2020, currently breastfeeding.  General: NAD HEENT: normocephalic, anicteric Pulmonary: No increased work of breathing Cardiovascular: RRR, distal pulses 2+ Extremities: no edema, erythema, or tenderness Neurologic: Grossly intact Psychiatric: mood appropriate, affect full  Imaging No results found.  Assessment: 39 y.o. [id number] presenting for scheduled hysteroscopy, D&C, endometrial ablation  Plan: 1) The patient was counseled on the overall effectiveness of endometrial ablation in achieving amenorrhea.  She is aware that some patient may continue to have menstrual cycles although these are generally greatly reduced in flow.  In addition she was quoted a failure rate for endometrial ablation of approximately 25% within the frist 4 years, but these failures may happen at any time during or after the initial 4 year postop period.  She is aware that pregnancy is contra-indicated in the setting of prior endometrial ablation, and that ablation itself does not confer any contraceptive benefit.  She will therefore need to continue to rely on some means of contraception following the procedure.  Although rare and generally confined to patient who have undergone prior tubal ligatoin, post-ablation tubal sterilizaton syndrome (PATSS) may also occur with no reliable incidence rates as the majority of published literature is limited to case reports.   Prior to being considered a candidate for Novasure ablation she  will need to undergo endometrial biopsy to rule out endometrial hyperplasia or malignancy as the cause of her bleeding, have an up to date pap on record, and undergo transvaginal ultrasound to verify the absence of focal endometrial lesion which may need to be addressed prior to proceeding with ablation.  In addition she is aware that the device is limited for use in women with a normal uterine cavity and uterine leiomyomata <3cm in size.  If present leiomyomata may increase the long-term failure rate of the procedure.  In rare instances the presence of a uterine septum or arcuate uterus, which may not be readily apparent on preoperative ultrasound, may necessitate the procedure to be aborted.     2) Routine postoperative instructions were reviewed with the patient and her family in detail today including the expected length of recovery and likely postoperative course.  The patient concurred with the proposed plan, giving informed written consent for the surgery today.  Patient instructed on the importance of being NPO after midnight prior to her procedure.  If warranted preoperative prophylactic antibiotics and SCDs ordered on call to the OR to meet SCIP guidelines and adhere to recommendation laid forth in ACOG Practice Bulletin Number 104 May 2009  "Antibiotic Prophylaxis   for Gynecologic Procedures".     Malachy Mood, MD, [facility], [facility] 12/08/2020, 10:22 AM

## 2020-12-09 ENCOUNTER — Other Ambulatory Visit
Admission: RE | Admit: 2020-12-09 | Discharge: 2020-12-09 | Disposition: A | Discharge status: Home / Self Care | Payer: No Typology Code available for payment source | Source: Ambulatory Visit | Attending: Obstetrics and Gynecology | Admitting: Obstetrics and Gynecology

## 2020-12-09 HISTORY — DX: Pneumonia, unspecified organism: J18.9

## 2020-12-09 NOTE — Patient Instructions (Addendum)
Your procedure is scheduled on: 12/18/20 Report to the Registration Desk on the 1st floor of the [facility]. To find out your arrival time, please call [phone] between 1PM - 3PM on: 12/17/20  REMEMBER: Instructions that are not followed completely may result in serious medical risk, up to and including death; or upon the discretion of your surgeon and anesthesiologist your surgery may need to be rescheduled.  Do not eat food after midnight the night before surgery.  No gum chewing, lozengers or hard candies.  You may however, drink CLEAR liquids up to 2 hours before you are scheduled to arrive for your surgery. Do not drink anything within 2 hours of your scheduled arrival time.  Clear liquids include: - water  - apple juice without pulp - gatorade (not RED, PURPLE, OR BLUE) - black coffee or tea (Do NOT add milk or creamers to the coffee or tea) Do NOT drink anything that is not on this list.  TAKE THESE MEDICATIONS THE MORNING OF SURGERY WITH A SIP OF WATER:  - levothyroxine (SYNTHROID, LEVOTHROID) 150 MCG tablet   One week prior to surgery: Stop Anti-inflammatories (NSAIDS) such as Advil, Aleve, Ibuprofen, Motrin, Naproxen, Naprosyn and Aspirin based products such as Excedrin, Goodys Powder, BC Powder.  Stop ANY OVER THE COUNTER supplements until after surgery, (VITAMIN C) 500 MG tablet, zinc gluconate 50 MG tablet (However, you may continue taking Vitamin D, Vitamin B, and multivitamin up until the day before surgery.)  No Alcohol for 24 hours before or after surgery.  No Smoking including e-cigarettes for 24 hours prior to surgery.  No chewable tobacco products for at least 6 hours prior to surgery.  No nicotine patches on the day of surgery.  Do not use any "recreational" drugs for at least a week prior to your surgery.  Please be advised that the combination of cocaine and anesthesia may have negative outcomes, up to and including death. If you test positive for  cocaine, your surgery will be cancelled.  On the morning of surgery brush your teeth with toothpaste and water, you may rinse your mouth with mouthwash if you wish. Do not swallow any toothpaste or mouthwash.  Do not wear jewelry, make-up, hairpins, clips or nail polish.  Do not wear lotions, powders, or perfumes.   Do not shave body from the neck down 48 hours prior to surgery just in case you cut yourself which could leave a site for infection.  Also, freshly shaved skin may become irritated if using the CHG soap.  Contact lenses, hearing aids and dentures may not be worn into surgery.  Do not bring valuables to the hospital. [facility] is not responsible for any missing/lost belongings or valuables.   Use CHG Soap or wipes as directed on instruction sheet.  Notify your doctor if there is any change in your medical condition (cold, fever, infection).  Wear comfortable clothing (specific to your surgery type) to the hospital.  Plan for stool softeners for home use; pain medications have a tendency to cause constipation. You can also help prevent constipation by eating foods high in fiber such as fruits and vegetables and drinking plenty of fluids as your diet allows.  After surgery, you can help prevent lung complications by doing breathing exercises.  Take deep breaths and cough every 1-2 hours. Your doctor may order a device called an Incentive Spirometer to help you take deep breaths. When coughing or sneezing, hold a pillow firmly against your incision with both hands.  This is called "splinting." Doing this helps protect your incision. It also decreases belly discomfort.  If you are being admitted to the hospital overnight, leave your suitcase in the car. After surgery it may be brought to your room.  If you are being discharged the day of surgery, you will not be allowed to drive home. You will need a responsible adult (18 years or older) to drive you home and stay with you  that night.   If you are taking public transportation, you will need to have a responsible adult (18 years or older) with you. Please confirm with your physician that it is acceptable to use public transportation.   Please call the [facility] at [phone] if you have any questions about these instructions.  Visitation Policy:  Patients undergoing a surgery or procedure may have one family member or support person with them as long as that person is not COVID-19 positive or experiencing its symptoms.  That person may remain in the waiting area during the procedure.  Inpatient Visitation:    Visiting hours are 7 a.m. to 8 p.m. Patients will be allowed one visitor. The visitor may change daily. The visitor must pass COVID-19 screenings, use hand sanitizer when entering and exiting the patient's room and wear a mask at all times, including in the patient's room. Patients must also wear a mask when staff or their visitor are in the room. Masking is required regardless of vaccination status. Systemwide, no visitors 17 or younger.

## 2020-12-17 ENCOUNTER — Other Ambulatory Visit: Payer: Self-pay

## 2020-12-17 ENCOUNTER — Other Ambulatory Visit
Admission: RE | Admit: 2020-12-17 | Discharge: 2020-12-17 | Disposition: A | Discharge status: Home / Self Care | Payer: No Typology Code available for payment source | Source: Ambulatory Visit | Attending: Obstetrics and Gynecology | Admitting: Obstetrics and Gynecology

## 2020-12-17 DIAGNOSIS — Z20822 Contact with and (suspected) exposure to covid-19: Secondary | ICD-10-CM | POA: Insufficient documentation

## 2020-12-17 DIAGNOSIS — Z01812 Encounter for preprocedural laboratory examination: Secondary | ICD-10-CM | POA: Insufficient documentation

## 2020-12-17 LAB — SARS CORONAVIRUS 2 (TAT 6-24 HRS): SARS Coronavirus 2: NEGATIVE

## 2020-12-18 ENCOUNTER — Encounter: Payer: Self-pay | Admitting: Obstetrics and Gynecology

## 2020-12-18 ENCOUNTER — Ambulatory Visit: Payer: No Typology Code available for payment source | Admitting: Certified Registered Nurse Anesthetist

## 2020-12-18 ENCOUNTER — Encounter
Admission: RE | Disposition: A | Discharge status: Home / Self Care | Payer: Self-pay | Source: Home / Self Care | Attending: Obstetrics and Gynecology

## 2020-12-18 ENCOUNTER — Ambulatory Visit
Admission: RE | Admit: 2020-12-18 | Discharge: 2020-12-18 | Disposition: A | Discharge status: Home / Self Care | Payer: No Typology Code available for payment source | Attending: Obstetrics and Gynecology | Admitting: Obstetrics and Gynecology

## 2020-12-18 ENCOUNTER — Other Ambulatory Visit: Payer: Self-pay

## 2020-12-18 ENCOUNTER — Other Ambulatory Visit: Payer: Self-pay | Admitting: Obstetrics and Gynecology

## 2020-12-18 DIAGNOSIS — Z79899 Other long term (current) drug therapy: Secondary | ICD-10-CM | POA: Insufficient documentation

## 2020-12-18 DIAGNOSIS — Z7951 Long term (current) use of inhaled steroids: Secondary | ICD-10-CM | POA: Diagnosis not present

## 2020-12-18 DIAGNOSIS — Z85850 Personal history of malignant neoplasm of thyroid: Secondary | ICD-10-CM | POA: Insufficient documentation

## 2020-12-18 DIAGNOSIS — Z87891 Personal history of nicotine dependence: Secondary | ICD-10-CM | POA: Diagnosis not present

## 2020-12-18 DIAGNOSIS — Z885 Allergy status to narcotic agent status: Secondary | ICD-10-CM | POA: Insufficient documentation

## 2020-12-18 DIAGNOSIS — N840 Polyp of corpus uteri: Secondary | ICD-10-CM | POA: Insufficient documentation

## 2020-12-18 DIAGNOSIS — Z809 Family history of malignant neoplasm, unspecified: Secondary | ICD-10-CM | POA: Diagnosis not present

## 2020-12-18 DIAGNOSIS — N711 Chronic inflammatory disease of uterus: Secondary | ICD-10-CM | POA: Diagnosis not present

## 2020-12-18 DIAGNOSIS — N939 Abnormal uterine and vaginal bleeding, unspecified: Secondary | ICD-10-CM | POA: Diagnosis not present

## 2020-12-18 HISTORY — PX: DILATATION AND CURETTAGE/HYSTEROSCOPY WITH MINERVA: SHX6851

## 2020-12-18 LAB — POCT PREGNANCY, URINE: Preg Test, Ur: NEGATIVE

## 2020-12-18 SURGERY — DILATATION AND CURETTAGE/HYSTEROSCOPY WITH MINERVA
Anesthesia: General

## 2020-12-18 MED ORDER — EPHEDRINE SULFATE 50 MG/ML IJ SOLN
INTRAMUSCULAR | Status: DC | PRN
  Administered 2020-12-18: 5 mg via INTRAVENOUS
  Administered 2020-12-18: 10 mg via INTRAVENOUS

## 2020-12-18 MED ORDER — LIDOCAINE HCL (PF) 2 % IJ SOLN
INTRAMUSCULAR | Status: AC
  Filled 2020-12-18: qty 5

## 2020-12-18 MED ORDER — ORAL CARE MOUTH RINSE: 15.0000 mL | Freq: Once | OROMUCOSAL | Status: AC

## 2020-12-18 MED ORDER — PROMETHAZINE HCL 25 MG/ML IJ SOLN
INTRAMUSCULAR | Status: AC
  Filled 2020-12-18: qty 1

## 2020-12-18 MED ORDER — LACTATED RINGERS IV SOLN: INTRAVENOUS | Status: DC

## 2020-12-18 MED ORDER — ONDANSETRON HCL 4 MG/2ML IJ SOLN
INTRAMUSCULAR | Status: AC
  Filled 2020-12-18: qty 2

## 2020-12-18 MED ORDER — ACETAMINOPHEN 500 MG PO TABS
ORAL_TABLET | ORAL | Status: AC
  Filled 2020-12-18: qty 2

## 2020-12-18 MED ORDER — PROMETHAZINE HCL 25 MG/ML IJ SOLN
INTRAMUSCULAR | Status: DC | PRN
  Administered 2020-12-18: 12.5 mg via INTRAVENOUS

## 2020-12-18 MED ORDER — LIDOCAINE HCL (CARDIAC) PF 100 MG/5ML IV SOSY
PREFILLED_SYRINGE | INTRAVENOUS | Status: DC | PRN
  Administered 2020-12-18: 100 mg via INTRAVENOUS

## 2020-12-18 MED ORDER — LEVONORGESTREL 20 MCG/24HR IU IUD
1.0000 | INTRAUTERINE_SYSTEM | INTRAUTERINE | Status: DC
  Filled 2020-12-18: qty 1

## 2020-12-18 MED ORDER — PROPOFOL 10 MG/ML IV BOLUS
INTRAVENOUS | Status: AC
  Filled 2020-12-18: qty 40

## 2020-12-18 MED ORDER — CHLORHEXIDINE GLUCONATE 0.12 % MT SOLN: 15.0000 mL | Freq: Once | OROMUCOSAL | Status: AC

## 2020-12-18 MED ORDER — CHLORHEXIDINE GLUCONATE 0.12 % MT SOLN
OROMUCOSAL | Status: AC
  Administered 2020-12-18: 15 mL via OROMUCOSAL
  Filled 2020-12-18: qty 15

## 2020-12-18 MED ORDER — FENTANYL CITRATE (PF) 100 MCG/2ML IJ SOLN
INTRAMUSCULAR | Status: AC
  Filled 2020-12-18: qty 2

## 2020-12-18 MED ORDER — PHENYLEPHRINE HCL (PRESSORS) 10 MG/ML IV SOLN
INTRAVENOUS | Status: DC | PRN
  Administered 2020-12-18 (×4): 100 ug via INTRAVENOUS

## 2020-12-18 MED ORDER — ROCURONIUM BROMIDE 100 MG/10ML IV SOLN
INTRAVENOUS | Status: DC | PRN
  Administered 2020-12-18: 10 mg via INTRAVENOUS

## 2020-12-18 MED ORDER — MIDAZOLAM HCL 2 MG/2ML IJ SOLN
INTRAMUSCULAR | Status: DC | PRN
  Administered 2020-12-18: 2 mg via INTRAVENOUS

## 2020-12-18 MED ORDER — HYDROCODONE-ACETAMINOPHEN 5-325 MG PO TABS: 1.0000 | ORAL_TABLET | Freq: Four times a day (QID) | ORAL | 0 refills | Status: DC | PRN

## 2020-12-18 MED ORDER — IBUPROFEN 600 MG PO TABS: 600.0000 mg | ORAL_TABLET | Freq: Four times a day (QID) | ORAL | 3 refills | Status: DC | PRN

## 2020-12-18 MED ORDER — MIDAZOLAM HCL 2 MG/2ML IJ SOLN
INTRAMUSCULAR | Status: AC
  Filled 2020-12-18: qty 2

## 2020-12-18 MED ORDER — DEXAMETHASONE SODIUM PHOSPHATE 10 MG/ML IJ SOLN
INTRAMUSCULAR | Status: DC | PRN
  Administered 2020-12-18: 10 mg via INTRAVENOUS

## 2020-12-18 MED ORDER — FENTANYL CITRATE (PF) 100 MCG/2ML IJ SOLN
INTRAMUSCULAR | Status: AC
  Administered 2020-12-18: 25 ug via INTRAVENOUS
  Filled 2020-12-18: qty 2

## 2020-12-18 MED ORDER — POVIDONE-IODINE 10 % EX SWAB: 2.0000 "application " | Freq: Once | CUTANEOUS | Status: DC

## 2020-12-18 MED ORDER — ACETAMINOPHEN 500 MG PO TABS
1000.0000 mg | ORAL_TABLET | Freq: Once | ORAL | Status: AC
  Administered 2020-12-18: 1000 mg via ORAL

## 2020-12-18 MED ORDER — FENTANYL CITRATE (PF) 100 MCG/2ML IJ SOLN
INTRAMUSCULAR | Status: DC | PRN
  Administered 2020-12-18 (×2): 50 ug via INTRAVENOUS

## 2020-12-18 MED ORDER — FENTANYL CITRATE (PF) 100 MCG/2ML IJ SOLN
25.0000 ug | INTRAMUSCULAR | Status: DC | PRN
Start: 2020-12-18 — End: 2020-12-18

## 2020-12-18 MED ORDER — ROCURONIUM BROMIDE 10 MG/ML (PF) SYRINGE
PREFILLED_SYRINGE | INTRAVENOUS | Status: AC
  Filled 2020-12-18: qty 10

## 2020-12-18 MED ORDER — PROPOFOL 10 MG/ML IV BOLUS
INTRAVENOUS | Status: DC | PRN
  Administered 2020-12-18: 200 mg via INTRAVENOUS

## 2020-12-18 MED ORDER — SUCCINYLCHOLINE CHLORIDE 20 MG/ML IJ SOLN
INTRAMUSCULAR | Status: DC | PRN
  Administered 2020-12-18: 120 mg via INTRAVENOUS

## 2020-12-18 MED ORDER — FAMOTIDINE 20 MG PO TABS: 20.0000 mg | ORAL_TABLET | Freq: Once | ORAL | Status: AC

## 2020-12-18 MED ORDER — KETOROLAC TROMETHAMINE 30 MG/ML IJ SOLN
INTRAMUSCULAR | Status: DC | PRN
  Administered 2020-12-18: 30 mg via INTRAVENOUS

## 2020-12-18 MED ORDER — ONDANSETRON 4 MG PO TBDP
4.0000 mg | ORAL_TABLET | Freq: Four times a day (QID) | ORAL | 0 refills | Status: DC | PRN
Start: 2020-12-18 — End: 2020-12-18

## 2020-12-18 MED ORDER — ONDANSETRON HCL 4 MG/2ML IJ SOLN
INTRAMUSCULAR | Status: DC | PRN
  Administered 2020-12-18: 4 mg via INTRAVENOUS

## 2020-12-18 MED ORDER — FAMOTIDINE 20 MG PO TABS
ORAL_TABLET | ORAL | Status: AC
  Administered 2020-12-18: 20 mg via ORAL
  Filled 2020-12-18: qty 1

## 2020-12-18 MED ORDER — DEXAMETHASONE SODIUM PHOSPHATE 10 MG/ML IJ SOLN
INTRAMUSCULAR | Status: AC
  Filled 2020-12-18: qty 1

## 2020-12-18 MED ORDER — ONDANSETRON HCL 4 MG/2ML IJ SOLN
4.0000 mg | Freq: Once | INTRAMUSCULAR | Status: AC | PRN
  Administered 2020-12-18: 4 mg via INTRAVENOUS

## 2020-12-18 SURGICAL SUPPLY — 24 items
CATH ROBINSON RED A/P 16FR (CATHETERS) ×2 IMPLANT
COVER WAND RF STERILE (DRAPES) ×2 IMPLANT
DEVICE MYOSURE LITE (MISCELLANEOUS) IMPLANT
DEVICE MYOSURE REACH (MISCELLANEOUS) IMPLANT
ELECT REM PT RETURN 9FT ADLT (ELECTROSURGICAL)
ELECTRODE REM PT RTRN 9FT ADLT (ELECTROSURGICAL) IMPLANT
GLOVE SURG ENC MOIS LTX SZ7 (GLOVE) ×2 IMPLANT
GLOVE SURG UNDER LTX SZ7.5 (GLOVE) ×2 IMPLANT
GOWN STRL REUS W/ TWL LRG LVL3 (GOWN DISPOSABLE) ×2 IMPLANT
GOWN STRL REUS W/TWL LRG LVL3 (GOWN DISPOSABLE) ×4
HANDPIECE ABLA MINERVA ENDO (MISCELLANEOUS) ×2 IMPLANT
INFUSOR MANOMETER BAG 3000ML (MISCELLANEOUS) IMPLANT
IV LACTATED RINGER IRRG 3000ML (IV SOLUTION) ×2
IV LR IRRIG 3000ML ARTHROMATIC (IV SOLUTION) ×1 IMPLANT
IV NS IRRIG 3000ML ARTHROMATIC (IV SOLUTION) ×2 IMPLANT
KIT PROCEDURE FLUENT (KITS) ×2 IMPLANT
KIT TURNOVER CYSTO (KITS) ×2 IMPLANT
MANIFOLD NEPTUNE II (INSTRUMENTS) ×2 IMPLANT
PACK DNC HYST (MISCELLANEOUS) ×2 IMPLANT
PAD OB MATERNITY 4.3X12.25 (PERSONAL CARE ITEMS) ×2 IMPLANT
PAD PREP 24X41 OB/GYN DISP (PERSONAL CARE ITEMS) ×2 IMPLANT
SEAL ROD LENS SCOPE MYOSURE (ABLATOR) ×2 IMPLANT
TOWEL OR 17X26 4PK STRL BLUE (TOWEL DISPOSABLE) ×2 IMPLANT
TUBING CONNECTING 10 (TUBING) ×2 IMPLANT

## 2020-12-18 NOTE — Transfer of Care (Signed)
Immediate Anesthesia Transfer of Care Note  Patient: Brooke Gordon  Procedure(s) Performed: DILATATION AND CURETTAGE/HYSTEROSCOPY WITH MINERVA (N/A )  Patient Location: PACU  Anesthesia Type:General  Level of Consciousness: awake and alert   Airway & Oxygen Therapy: Patient Spontanous Breathing and Patient connected to face mask oxygen  Post-op Assessment: Report given to RN and Post -op Vital signs reviewed and stable  Post vital signs: Reviewed and stable  Last Vitals:  Vitals Value Taken Time  BP 146/85 12/18/20 1249  Temp 36.3 C 12/18/20 1250  Pulse 97 12/18/20 1257  Resp 16 12/18/20 1257  SpO2 94 % 12/18/20 1257  Vitals shown include unvalidated device data.  Last Pain:  Vitals:   12/18/20 1250  TempSrc:   PainSc: 3          Complications: No complications documented.

## 2020-12-18 NOTE — Interval H&P Note (Signed)
History and Physical Interval Note:  12/18/2020 11:13 AM  Brooke Gordon  has presented today for surgery, with the diagnosis of AUB N93.9 Endometrial polyp N84.0 Abnormal endometrial ultrasound R93.5.  The various methods of treatment have been discussed with the patient and family. After consideration of risks, benefits and other options for treatment, the patient has consented to  Procedure(s): DILATATION AND CURETTAGE/HYSTEROSCOPY WITH MINERVA (N/A) INTRAUTERINE DEVICE (IUD) INSERTION (N/A) as a surgical intervention.  The patient's history has been reviewed, patient examined, no change in status, stable for surgery.  I have reviewed the patient's chart and labs.  Questions were answered to the patient's satisfaction.     Malachy Mood

## 2020-12-18 NOTE — Anesthesia Postprocedure Evaluation (Signed)
Anesthesia Post Note  Patient: Brooke Gordon  Procedure(s) Performed: DILATATION AND CURETTAGE/HYSTEROSCOPY WITH MINERVA (N/A )  Patient location during evaluation: PACU Anesthesia Type: General Level of consciousness: awake and alert and oriented Pain management: pain level controlled Vital Signs Assessment: post-procedure vital signs reviewed and stable Respiratory status: spontaneous breathing Cardiovascular status: blood pressure returned to baseline Anesthetic complications: no   No complications documented.   Last Vitals:  Vitals:   12/18/20 1400 12/18/20 1420  BP: 126/77 140/67  Pulse: (!) 58 75  Resp: 18 16  Temp: 36.6 C 36.7 C  SpO2: 99% 98%    Last Pain:  Vitals:   12/18/20 1420  TempSrc: Temporal  PainSc: 2                  Faryn Sieg

## 2020-12-18 NOTE — Anesthesia Preprocedure Evaluation (Signed)
Anesthesia Evaluation  Patient identified by MRN, date of birth, ID band Patient awake    Reviewed: Allergy & Precautions, NPO status , Patient's Chart, lab work & pertinent test results  History of Anesthesia Complications (+) PONV and history of anesthetic complications  Airway Mallampati: III  TM Distance: >3 FB     Dental   Pulmonary neg pulmonary ROS, former smoker,    Pulmonary exam normal        Cardiovascular negative cardio ROS Normal cardiovascular exam     Neuro/Psych negative neurological ROS  negative psych ROS   GI/Hepatic negative GI ROS, Neg liver ROS,   Endo/Other  Hypothyroidism   Renal/GU negative Renal ROS  Female GU complaint     Musculoskeletal negative musculoskeletal ROS (+)   Abdominal Normal abdominal exam  (+)   Peds negative pediatric ROS (+)  Hematology  (+) anemia ,   Anesthesia Other Findings Past Medical History: No date: Anemia     Comment:  pregnancy only No date: Complication of anesthesia No date: Hypothyroidism     Comment:  h/o thyroidectomy 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 No date: Malignant neoplasm of thyroid gland ([place]) 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[place]) No date: Pneumonia     Comment:  08/2020 No date: PONV (postoperative nausea and vomiting)  Reproductive/Obstetrics                             Anesthesia Physical  Anesthesia Plan  ASA: II  Anesthesia Plan: General   Post-op Pain Management:    Induction: Intravenous  PONV Risk Score and Plan:   Airway Management Planned: Oral ETT  Additional Equipment:   Intra-op Plan:   Post-operative Plan: Extubation in OR  Informed Consent: I have reviewed the patients History and Physical, chart, labs and discussed the procedure including the risks, benefits and alternatives for the proposed anesthesia with the patient or authorized representative who has indicated his/her understanding  and acceptance.     Dental advisory given  Plan Discussed with: CRNA and Surgeon  Anesthesia Plan Comments:         Anesthesia Quick Evaluation

## 2020-12-18 NOTE — Anesthesia Procedure Notes (Signed)
Procedure Name: Intubation Performed by: Demetrius Charity, CRNA Pre-anesthesia Checklist: Patient identified, Patient being monitored, Timeout performed, Emergency Drugs available and Suction available Patient Re-evaluated:Patient Re-evaluated prior to induction Oxygen Delivery Method: Circle system utilized Preoxygenation: Pre-oxygenation with 100% oxygen Induction Type: IV induction Ventilation: Mask ventilation without difficulty Laryngoscope Size: 3 and McGraph Grade View: Grade I Tube type: Oral Tube size: 6.5 mm Number of attempts: 1 Airway Equipment and Method: Stylet and Video-laryngoscopy Placement Confirmation: ETT inserted through vocal cords under direct vision,  positive ETCO2 and breath sounds checked- equal and bilateral Secured at: 22 cm Tube secured with: Tape Dental Injury: Teeth and Oropharynx as per pre-operative assessment

## 2020-12-18 NOTE — Op Note (Signed)
Preoperative Diagnosis: 1) 40 y.o.  abnormal uterine bleeding  Postoperative Diagnosis: 1) 40 y.o. abnormal uterine bleeding  Operation Performed: Hysteroscopy, dilation and curettage, Minerva endometrial ablation  Indication:The patient was previously counseled on the overall effectiveness of the device in achieving amenorrhea.  She is aware that some patient may continue to have menstrual cycles although these are generally greatly reduced in flow.  In addition she was quoted a failure rate for endometrial ablation of approximately 25% within the frist 4 years, but these failures may happen at any time during or after the initial 4 year postop period.  She is aware that pregnancy is contra-indicated in the setting of prior endometrial ablation, and that ablation itself does not confer and contraceptive benefits and that she will need to continue to rely on some means of contraception following the procedure.  Although rare and generally confined to patient who have undergone prior tubal ligatoin, post-ablation tubal sterilizaton syndrome (PATSS) may also occur.   Surgeon: Malachy Mood, MD  Anesthesia: General  Preoperative Antibiotics: none  Estimated Blood Loss: 10 mL  IV Fluids: 740mL  Urine Output:: ~54mL  Drains or Tubes: none  Implants: none  Specimens Removed: Endometrial curettings  Complications: none  Intraoperative Findings: Normal appearing vagina mucosa and cervix.  Normal uterine cavity.  Good coverage of the uterine cavity on post-tablation hysteroscopy with ablation of the uterine cornua and sparing of the endocervix.   Patient Condition: stable  Procedure in Detail:  FINDINGS: Exam under anesthesia revealed small, mobile uterus with no masses and bilateral adnexa without masses or fullness. Hysteroscopy revealed no evidence of polyp (suspected after endometrial biopsy), otherwise grossly normal appearing uterine cavity with bilateral tubal ostia and normal  appearing endocervical canal. Findings after ablation revealed globally ablated endometrium.    PROCEDURE IN DETAIL: After informed consent was obtained, the patient was taken to the operating room where anesthesia was obtained without difficulty. The patient was positioned in the dorsal lithotomy position using Allen stirrups. The patient's bladder was decompressed using a red rubber catheter. The patient was examined under anesthesia, with the above noted findings.  An operative speculum was placed inside the patient's vagina, the cervix was adequately visualized, and the the anterior lip of the cervix was grasped with a single tooth tenaculum. The uterine cavity was sounded to 10 cm, and then the cervix was progressively dilated to  66mm using Pratt dilators. The 0 degree hysteroscope was introduced through the cervix and advanced under direct visualization in the uterine cavity.  Normal saline was used as the distending fluid during the hysteroscopy.  This revealed the above noted intracavitary findings. The cervical length was obtained by retracting the hysteroscope to the level of the internal cervical os and marking that point on the hysteroscopy.  This measurement yielded a value of 5.5cm, yielding a total cavity length of 4.5cm based on the previously obtained sounding measurement  The hystersocope was removed and the uterine cavity was curetted until a gritty texture was noted, the resulting endometrial curetting was sent to pathology.    The Minerva was then placed without difficulty. The Minerva array was deployed and seated, noting cavity width to be adequate to proceed The cuff balloon was inflated.  The Minerva device passed the cavity assessment test.  Following this the device was activated and completed to total ablation time of 120 seconds. The Minerva device was removed and repeat hysteroscopy reveals an appropriate lining of the uterus and no perforation or injury. Hysteroscope is removed  with minimal discrepancy of fluid.   Tenaculum was removed with excellent hemostasis noted after application of silver nitrate to the tenaculum entry sites. She was then taken out of dorsal lithotomy. Hemostasis noted.  The patient tolerated the procedure well. Sponge, lap and needle counts were correct times two. The patient was taken to recovery room in excellent condition.

## 2020-12-19 ENCOUNTER — Encounter: Payer: Self-pay | Admitting: Obstetrics and Gynecology

## 2020-12-19 LAB — SURGICAL PATHOLOGY

## 2020-12-23 ENCOUNTER — Other Ambulatory Visit: Payer: Self-pay

## 2020-12-23 ENCOUNTER — Telehealth (INDEPENDENT_AMBULATORY_CARE_PROVIDER_SITE_OTHER): Payer: Self-pay | Admitting: Gastroenterology

## 2020-12-23 DIAGNOSIS — Z1211 Encounter for screening for malignant neoplasm of colon: Secondary | ICD-10-CM

## 2020-12-23 MED ORDER — NA SULFATE-K SULFATE-MG SULF 17.5-3.13-1.6 GM/177ML PO SOLN: 1.0000 | Freq: Once | ORAL | 0 refills | Status: DC

## 2020-12-23 NOTE — Progress Notes (Signed)
Gastroenterology Pre-Procedure Review  Request Date: Thursday 01/15/21 Requesting Physician: Dr. Marius Ditch  PATIENT REVIEW QUESTIONS: The patient responded to the following health history questions as indicated:    1. Are you having any GI issues? no 2. Do you have a personal history of Polyps? no 3. Do you have a family history of Colon Cancer or Polyps? no 4. Diabetes Mellitus? no 5. Joint replacements in the past 12 months?no 6. Major health problems in the past 3 months?no 7. Any artificial heart valves, MVP, or defibrillator?no    MEDICATIONS & ALLERGIES:    Patient reports the following regarding taking any anticoagulation/antiplatelet therapy:   Plavix, Coumadin, Eliquis, Xarelto, Lovenox, Pradaxa, Brilinta, or Effient? no Aspirin? no  Patient confirms/reports the following medications:  Current Outpatient Medications  Medication Sig Dispense Refill  . ascorbic acid (VITAMIN C) 500 MG tablet Take 500 mg by mouth daily.    . Cholecalciferol 25 MCG (1000 UT) capsule Take 1,000 Units by mouth daily.    Marland Kitchen HYDROcodone-acetaminophen (NORCO/VICODIN) 5-325 MG tablet Take 1 tablet by mouth every 6 (six) hours as needed. 20 tablet 0  . ibuprofen (ADVIL) 600 MG tablet Take 1 tablet (600 mg total) by mouth every 6 (six) hours as needed. 60 tablet 3  . levothyroxine (SYNTHROID, LEVOTHROID) 150 MCG tablet Take 150 mcg by mouth daily before breakfast.    . Multiple Vitamin (MULTIVITAMIN) capsule Take 1 capsule by mouth daily.    . ondansetron (ZOFRAN ODT) 4 MG disintegrating tablet Take 1 tablet (4 mg total) by mouth every 6 (six) hours as needed for nausea. 30 tablet 0  . zinc gluconate 50 MG tablet Take 50 mg by mouth daily.     No current facility-administered medications for this visit.   Facility-Administered Medications Ordered in Other Visits  Medication Dose Route Frequency Provider Last Rate Last Admin  . bupivacaine (SENSORCAINE-MPF) 0.75 % injection    Anesthesia Intra-op Gunnar Bulla, MD   0.5 mL at 03/11/15 3343    Patient confirms/reports the following allergies:  Allergies  Allergen Reactions  . Codeine Other (See Comments)    Childhood reaction  Reaction:  Unknown     No orders of the defined types were placed in this encounter.   AUTHORIZATION INFORMATION Primary Insurance: 1D#: Group #:  Secondary Insurance: 1D#: Group #:  SCHEDULE INFORMATION: Date: 01/15/21 Time: Location:[facility]

## 2020-12-25 ENCOUNTER — Other Ambulatory Visit: Payer: Self-pay

## 2020-12-25 ENCOUNTER — Ambulatory Visit (INDEPENDENT_AMBULATORY_CARE_PROVIDER_SITE_OTHER): Payer: No Typology Code available for payment source | Admitting: Obstetrics and Gynecology

## 2020-12-25 ENCOUNTER — Encounter: Payer: Self-pay | Admitting: Obstetrics and Gynecology

## 2020-12-25 VITALS — BP 132/86 | Ht 66.0 in | Wt 258.5 lb

## 2020-12-25 DIAGNOSIS — Z4889 Encounter for other specified surgical aftercare: Secondary | ICD-10-CM

## 2020-12-25 NOTE — Progress Notes (Signed)
      Postoperative Follow-up Patient presents post op from hysteroscopy, D&C, endometrial ablation 1weeks ago for abnormal uterine bleeding.  Subjective: Patient reports marked improvement in her preop symptoms. Eating a regular diet without difficulty. Pain is controlled without any medications.  Activity: normal activities of daily living.  Objective: Blood pressure 132/86, height 5\' 6"  (1.676 m), weight 258 lb 8 oz (117.3 kg), last menstrual period 12/05/2020, currently breastfeeding.  General: NAD, well nourished, appears stated age Pulmonary: no increased work of breathing Neurologic: normal gait    Admission on 12/18/2020, Discharged on 12/18/2020  Component Date Value Ref Range Status  . Preg Test, Ur 12/18/2020 NEGATIVE  NEGATIVE Final   Comment:        THE SENSITIVITY OF THIS METHODOLOGY IS >24 mIU/mL   . SURGICAL PATHOLOGY 12/18/2020    Final-Edited                   Value:SURGICAL PATHOLOGY CASE: [id number] PATIENT: Brooke Gordon Surgical Pathology Report     Specimen Submitted: A. Endometrial Curettings  Clinical History: AUB N93.9, Endometrial polyp N84.0, Abnormal endometrial ultrasound R93.5      DIAGNOSIS: A. ENDOMETRIAL CURETTINGS: - BENIGN PROLIFERATIVE ENDOMETRIUM WITH CHRONIC ENDOMETRITIS AND FOCAL CILIATED METAPLASIA. - FRAGMENTS OF ENDOMETRIAL POLYP. - BENIGN ENDOCERVICAL GLANDULAR MUCOSA. - NEGATIVE FOR ATYPIA / EIN AND MALIGNANCY.  GROSS DESCRIPTION: A. Labeled: Endometrial curettings Received: Formalin Collection time: 12:24 PM on 12/18/2020 per requisition (per container 12/17/2020) Placed into formalin time: 12:27 PM on 12/18/2020 per requisition (per container 12/17/2020) Tissue fragment(s): Multiple Size: Aggregate, 3.2 x 3 x 0.4 cm Description: Received on a Telfa pad are fragments of red-pink soft tissue and clear gelatinous material. Entirely submitted in cassettes 1-2.  RB 12/18/2020   Final Diagn                          osis performed by Quay Burow, MD.   Electronically signed 12/19/2020 9:20:40AM The electronic signature indicates that the named Attending Pathologist has evaluated the specimen Technical component performed at [facility], [address], [place], [place] [address] Lab: [phone] Dir: Rush Farmer, MD, MMM  Professional component performed at [facility], [facility], [place], [place], [place] [address] Lab: [phone] Dir: Dellia Nims. Reuel Derby, MD      Assessment: 40 y.o. s/p hysteroscopy, D&C, endometrial ablation stable  Plan: Patient has done well after surgery with no apparent complications.  I have discussed the post-operative course to date, and the expected progress moving forward.  The patient understands what complications to be concerned about.  I will see the patient in routine follow up, or sooner if needed.    Activity plan: No restriction.   Malachy Mood, MD, [facility], [facility] 12/25/2020, 9:15 AM

## 2021-01-13 ENCOUNTER — Other Ambulatory Visit
Admission: RE | Admit: 2021-01-13 | Discharge: 2021-01-13 | Disposition: A | Discharge status: Home / Self Care | Payer: No Typology Code available for payment source | Source: Ambulatory Visit | Attending: Gastroenterology | Admitting: Gastroenterology

## 2021-01-13 ENCOUNTER — Other Ambulatory Visit: Payer: Self-pay

## 2021-01-13 DIAGNOSIS — Z20822 Contact with and (suspected) exposure to covid-19: Secondary | ICD-10-CM | POA: Insufficient documentation

## 2021-01-13 DIAGNOSIS — Z01812 Encounter for preprocedural laboratory examination: Secondary | ICD-10-CM | POA: Diagnosis not present

## 2021-01-14 LAB — SARS CORONAVIRUS 2 (TAT 6-24 HRS): SARS Coronavirus 2: NEGATIVE

## 2021-01-15 ENCOUNTER — Ambulatory Visit
Admission: RE | Admit: 2021-01-15 | Discharge: 2021-01-15 | Disposition: A | Discharge status: Home / Self Care | Payer: No Typology Code available for payment source | Attending: Gastroenterology | Admitting: Gastroenterology

## 2021-01-15 ENCOUNTER — Other Ambulatory Visit: Payer: Self-pay

## 2021-01-15 ENCOUNTER — Ambulatory Visit: Payer: No Typology Code available for payment source | Admitting: Certified Registered"

## 2021-01-15 ENCOUNTER — Encounter: Payer: Self-pay | Admitting: Gastroenterology

## 2021-01-15 ENCOUNTER — Encounter
Admission: RE | Disposition: A | Discharge status: Home / Self Care | Payer: Self-pay | Source: Home / Self Care | Attending: Gastroenterology

## 2021-01-15 DIAGNOSIS — K648 Other hemorrhoids: Secondary | ICD-10-CM | POA: Diagnosis not present

## 2021-01-15 DIAGNOSIS — K635 Polyp of colon: Secondary | ICD-10-CM | POA: Diagnosis not present

## 2021-01-15 DIAGNOSIS — K5730 Diverticulosis of large intestine without perforation or abscess without bleeding: Secondary | ICD-10-CM | POA: Insufficient documentation

## 2021-01-15 DIAGNOSIS — Z885 Allergy status to narcotic agent status: Secondary | ICD-10-CM | POA: Insufficient documentation

## 2021-01-15 DIAGNOSIS — Z87891 Personal history of nicotine dependence: Secondary | ICD-10-CM | POA: Diagnosis not present

## 2021-01-15 DIAGNOSIS — D123 Benign neoplasm of transverse colon: Secondary | ICD-10-CM | POA: Diagnosis not present

## 2021-01-15 DIAGNOSIS — Z79899 Other long term (current) drug therapy: Secondary | ICD-10-CM | POA: Diagnosis not present

## 2021-01-15 DIAGNOSIS — Z79890 Hormone replacement therapy (postmenopausal): Secondary | ICD-10-CM | POA: Diagnosis not present

## 2021-01-15 DIAGNOSIS — Z1211 Encounter for screening for malignant neoplasm of colon: Secondary | ICD-10-CM

## 2021-01-15 DIAGNOSIS — Z85850 Personal history of malignant neoplasm of thyroid: Secondary | ICD-10-CM | POA: Diagnosis not present

## 2021-01-15 DIAGNOSIS — D125 Benign neoplasm of sigmoid colon: Secondary | ICD-10-CM | POA: Insufficient documentation

## 2021-01-15 HISTORY — PX: COLONOSCOPY WITH PROPOFOL: SHX5780

## 2021-01-15 LAB — POCT PREGNANCY, URINE: Preg Test, Ur: NEGATIVE

## 2021-01-15 SURGERY — COLONOSCOPY WITH PROPOFOL
Anesthesia: General

## 2021-01-15 MED ORDER — LIDOCAINE HCL (CARDIAC) PF 100 MG/5ML IV SOSY
PREFILLED_SYRINGE | INTRAVENOUS | Status: DC | PRN
  Administered 2021-01-15: 100 mg via INTRAVENOUS

## 2021-01-15 MED ORDER — GLYCOPYRROLATE 0.2 MG/ML IJ SOLN
INTRAMUSCULAR | Status: DC | PRN
  Administered 2021-01-15: .2 mg via INTRAVENOUS

## 2021-01-15 MED ORDER — MIDAZOLAM HCL 2 MG/2ML IJ SOLN
INTRAMUSCULAR | Status: DC | PRN
  Administered 2021-01-15: 2 mg via INTRAVENOUS

## 2021-01-15 MED ORDER — SODIUM CHLORIDE 0.9 % IV SOLN: INTRAVENOUS | Status: DC

## 2021-01-15 MED ORDER — PROPOFOL 10 MG/ML IV BOLUS
INTRAVENOUS | Status: DC | PRN
  Administered 2021-01-15: 60 mg via INTRAVENOUS
  Administered 2021-01-15: 10 mg via INTRAVENOUS
  Administered 2021-01-15: 30 mg via INTRAVENOUS

## 2021-01-15 MED ORDER — PROPOFOL 500 MG/50ML IV EMUL
INTRAVENOUS | Status: DC | PRN
  Administered 2021-01-15: 155 ug/kg/min via INTRAVENOUS

## 2021-01-15 MED ORDER — MIDAZOLAM HCL 2 MG/2ML IJ SOLN
INTRAMUSCULAR | Status: AC
  Filled 2021-01-15: qty 2

## 2021-01-15 MED ORDER — PROPOFOL 500 MG/50ML IV EMUL
INTRAVENOUS | Status: AC
  Filled 2021-01-15: qty 500

## 2021-01-15 NOTE — Op Note (Signed)
[facility] Gastroenterology Patient Name: Brooke Gordon Procedure Date: 01/15/2021 7:30 AM MRN: [id number] Account #: [id number] Date of Birth: 04/18/1981 Admit Type: Outpatient Age: 40 Room: [facility] ENDO ROOM 2 Gender: Female Note Status: Finalized Procedure:             Colonoscopy Indications:           Screening for colorectal malignant neoplasm Providers:             Camdon Saetern B. Bonna Gains MD, MD Referring MD:          Mar Daring (Referring MD) Medicines:             Monitored Anesthesia Care Complications:         No immediate complications. Procedure:             Pre-Anesthesia Assessment:                        - ASA Grade Assessment: II - A patient with mild                         systemic disease.                        - Prior to the procedure, a History and Physical was                         performed, and patient medications, allergies and                         sensitivities were reviewed. The patient's tolerance                         of previous anesthesia was reviewed.                        - The risks and benefits of the procedure and the                         sedation options and risks were discussed with the                         patient. All questions were answered and informed                         consent was obtained.                        - Patient identification and proposed procedure were                         verified prior to the procedure by the physician, the                         nurse, the anesthesiologist, the anesthetist and the                         technician. The procedure was verified in the                         procedure room.  After obtaining informed consent, the colonoscope was                         passed under direct vision. Throughout the procedure,                         the patient's blood pressure, pulse, and oxygen                         saturations were  monitored continuously. The                         Colonoscope was introduced through the anus and                         advanced to the the cecum, identified by appendiceal                         orifice and ileocecal valve. The colonoscopy was                         performed with ease. The patient tolerated the                         procedure well. The quality of the bowel preparation                         was good. Findings:      The perianal and digital rectal examinations were normal.      Three sessile polyps were found in the sigmoid colon and transverse       colon. The polyps were 4 to 5 mm in size. These polyps were removed with       a cold snare. Resection and retrieval were complete.      Multiple diverticula were found in the sigmoid colon.      The exam was otherwise without abnormality.      The rectum, sigmoid colon, descending colon, transverse colon, ascending       colon and cecum appeared normal.      Anal papilla(e) were hypertrophied.      Non-bleeding internal hemorrhoids were found during retroflexion and       during endoscopy.      No additional abnormalities were found on retroflexion. Impression:            - Three 4 to 5 mm polyps in the sigmoid colon and in                         the transverse colon, removed with a cold snare.                         Resected and retrieved.                        - Diverticulosis in the sigmoid colon.                        - The examination was otherwise normal.                        -  The rectum, sigmoid colon, descending colon,                         transverse colon, ascending colon and cecum are normal.                        - Anal papilla(e) were hypertrophied.                        - Non-bleeding internal hemorrhoids. Recommendation:        - Discharge patient to home (with escort).                        - High fiber diet.                        - Advance diet as tolerated.                         - Continue present medications.                        - Await pathology results.                        - Repeat colonoscopy date to be determined after                         pending pathology results are reviewed.                        - The findings and recommendations were discussed with                         the patient.                        - The findings and recommendations were discussed with                         the patient's family.                        - Return to primary care physician as previously                         scheduled. Procedure Code(s):     --- Professional ---                        [phone], Colonoscopy, flexible; with removal of                         tumor(s), polyp(s), or other lesion(s) by snare                         technique Diagnosis Code(s):     --- Professional ---                        Z12.11, Encounter for screening for malignant neoplasm  of colon                        K63.5, Polyp of colon CPT copyright 2019 American Medical Association. All rights reserved. The codes documented in this report are preliminary and upon coder review may  be revised to meet current compliance requirements.  Vonda Antigua, MD Margretta Sidle B. Bonna Gains MD, MD 01/15/2021 8:08:47 AM This report has been signed electronically. Number of Addenda: 0 Note Initiated On: 01/15/2021 7:30 AM Scope Withdrawal Time: 0 hours 18 minutes 26 seconds  Total Procedure Duration: 0 hours 21 minutes 19 seconds  Estimated Blood Loss:  Estimated blood loss: none.      [facility]

## 2021-01-15 NOTE — Anesthesia Procedure Notes (Signed)
Procedure Name: General with mask airway Performed by: Fletcher-Harrison, Isaih Bulger, CRNA Pre-anesthesia Checklist: Patient identified, Emergency Drugs available, Suction available and Patient being monitored Patient Re-evaluated:Patient Re-evaluated prior to induction Oxygen Delivery Method: Simple face mask Induction Type: IV induction Placement Confirmation: positive ETCO2 and CO2 detector Dental Injury: Teeth and Oropharynx as per pre-operative assessment        

## 2021-01-15 NOTE — Anesthesia Postprocedure Evaluation (Signed)
Anesthesia Post Note  Patient: Brooke Gordon  Procedure(s) Performed: COLONOSCOPY WITH PROPOFOL (N/A )  Patient location during evaluation: PACU Anesthesia Type: General Level of consciousness: awake and alert Pain management: pain level controlled Vital Signs Assessment: post-procedure vital signs reviewed and stable Respiratory status: spontaneous breathing, nonlabored ventilation and respiratory function stable Cardiovascular status: blood pressure returned to baseline and stable Postop Assessment: no apparent nausea or vomiting Anesthetic complications: no   No complications documented.   Last Vitals:  Vitals:   01/15/21 0810 01/15/21 0820  BP: 126/75 130/65  Pulse: 74 73  Resp: 16 20  Temp:    SpO2: 100% 97%    Last Pain:  Vitals:   01/15/21 0800  TempSrc: Temporal  PainSc:                  Tera Mater

## 2021-01-15 NOTE — Transfer of Care (Signed)
Immediate Anesthesia Transfer of Care Note  Patient: Brooke Gordon  Procedure(s) Performed: COLONOSCOPY WITH PROPOFOL (N/A )  Patient Location: Endoscopy Unit  Anesthesia Type:General  Level of Consciousness: drowsy and patient cooperative  Airway & Oxygen Therapy: Patient Spontanous Breathing and Patient connected to face mask oxygen  Post-op Assessment: Report given to RN and Post -op Vital signs reviewed and stable  Post vital signs: Reviewed and stable  Last Vitals:  Vitals Value Taken Time  BP 112/57 01/15/21 0808  Temp    Pulse 77 01/15/21 0808  Resp 17 01/15/21 0808  SpO2 99 % 01/15/21 0808  Vitals shown include unvalidated device data.  Last Pain:  Vitals:   01/15/21 0707  TempSrc: Temporal  PainSc: 0-No pain         Complications: No complications documented.

## 2021-01-15 NOTE — H&P (Signed)
Brooke Antigua, MD [address] [place], [place], [place], [address] [address], [place], [place], [place], [address] Phone: [phone]  Fax: [phone]  Primary Care Physician:  Mar Daring, PA-C   Pre-Procedure History & Physical: HPI:  Brooke Gordon is a 40 y.o. female is here for a colonoscopy.   Past Medical History:  Diagnosis Date  . Anemia    pregnancy only  . Complication of anesthesia   . Hypothyroidism    h/o thyroidectomy  . Macrosomia   . Malignant neoplasm of thyroid gland ([place])   . 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 Pneumonia    08/2020  . PONV (postoperative nausea and vomiting)     Past Surgical History:  Procedure Laterality Date  . BILATERAL SALPINGECTOMY    . COLPOSCOPY  07/2012   neg  . DILATATION AND CURETTAGE/HYSTEROSCOPY WITH MINERVA N/A 12/18/2020   Procedure: DILATATION AND CURETTAGE/HYSTEROSCOPY WITH MINERVA;  Surgeon: Malachy Mood, MD;  Location: [facility] ORS;  Service: Gynecology;  Laterality: N/A;  . LAPAROSCOPIC TUBAL LIGATION N/A 02/03/2017   Procedure: LAPAROSCOPIC TUBAL LIGATION via SALPINGECTOMY;  Surgeon: Malachy Mood, MD;  Location: [facility] ORS;  Service: Gynecology;  Laterality: N/A;  . THYROIDECTOMY N/A 06/25/2015   Procedure: Total thyroidectomy with laryngeal nerve monitoring ;  Surgeon: Clyde Canterbury, MD;  Location: [facility] ORS;  Service: ENT;  Laterality: N/A;  . TUBAL LIGATION  02/03/2017  . WISDOM TOOTH EXTRACTION      Prior to Admission medications   Medication Sig Start Date End Date Taking? Authorizing Provider  ascorbic acid (VITAMIN C) 500 MG tablet Take 500 mg by mouth daily.   Yes [provider]  Cholecalciferol 25 MCG (1000 UT) capsule Take 1,000 Units by mouth daily.   Yes [provider]  levothyroxine (SYNTHROID, LEVOTHROID) 150 MCG tablet Take 150 mcg by mouth daily before breakfast. 03/01/17  Yes [provider]  Multiple Vitamin (MULTIVITAMIN) capsule Take  1 capsule by mouth daily.   Yes [provider]  zinc gluconate 50 MG tablet Take 50 mg by mouth daily.   Yes [provider]  HYDROcodone-acetaminophen (NORCO/VICODIN) 5-325 MG tablet Take 1 tablet by mouth every 6 (six) hours as needed. Patient not taking: Reported on 01/15/2021 12/18/20   Malachy Mood, MD  ibuprofen (ADVIL) 600 MG tablet Take 1 tablet (600 mg total) by mouth every 6 (six) hours as needed. Patient not taking: Reported on 01/15/2021 12/18/20   Malachy Mood, MD  ondansetron (ZOFRAN ODT) 4 MG disintegrating tablet Take 1 tablet (4 mg total) by mouth every 6 (six) hours as needed for nausea. 12/18/20   Malachy Mood, MD    Allergies as of 12/23/2020 - Review Complete 12/18/2020  Allergen Reaction Noted  . Codeine Other (See Comments) 03/10/2015    Family History  Problem Relation Age of Onset  . Cancer Other     Social History   Socioeconomic History  . Marital status: Married    Spouse name: Not on file  . Number of children: 5  . Years of education: Not on file  . Highest education level: Not on file  Occupational History  . Not on file  Tobacco Use  . Smoking status: Former Smoker    Packs/day: 0.25    Years: 10.00    Pack years: 2.50    Types: Cigarettes    Quit date: 06/23/2008    Years since quitting: 12.5  . Smokeless tobacco: Never Used  Vaping Use  . Vaping  Use: Never used  Substance and Sexual Activity  . Alcohol use: Yes    Comment: very rare  . Drug use: No  . Sexual activity: Yes    Birth control/protection: Surgical  Other Topics Concern  . Not on file  Social History Narrative  . Not on file   Social Determinants of Health   Financial Resource Strain: Not on file  Food Insecurity: Not on file  Transportation Needs: Not on file  Physical Activity: Not on file  Stress: Not on file  Social Connections: Not on file  Intimate Partner Violence: Not on file    Review of Systems: See HPI, otherwise negative  ROS  Physical Exam: BP (!) 148/81   Pulse 81   Temp (!) 97.3 F (36.3 C) (Temporal)   Resp 16   Ht 5\' 6"  (1.676 m)   Wt 117.5 kg   LMP 01/02/2021 (Approximate)   SpO2 99%   BMI 41.80 kg/m  General:   Alert,  pleasant and cooperative in NAD Head:  Normocephalic and atraumatic. Neck:  Supple; no masses or thyromegaly. Lungs:  Clear throughout to auscultation, normal respiratory effort.    Heart:  +S1, +S2, Regular rate and rhythm, No edema. Abdomen:  Soft, nontender and nondistended. Normal bowel sounds, without guarding, and without rebound.   Neurologic:  Alert and  oriented x4;  grossly normal neurologically.  Impression/Plan: Brooke Gordon is here for a colonoscopy to be performed for screening. Pt has had radioactive treatment for thyroid cancer and due to potential increased risks of colon cancer, pt was referred for screening colonoscopy.  Risks, benefits, limitations, and alternatives regarding  colonoscopy have been reviewed with the patient.  Questions have been answered.  All parties agreeable.   Virgel Manifold, MD  01/15/2021, 7:30 AM

## 2021-01-15 NOTE — Anesthesia Preprocedure Evaluation (Addendum)
Anesthesia Evaluation  Patient identified by MRN, date of birth, ID band Patient awake    Reviewed: Allergy & Precautions, H&P , NPO status , Patient's Chart, lab work & pertinent test results  History of Anesthesia Complications (+) PONV  Airway Mallampati: II  TM Distance: >3 FB     Dental  (+) Teeth Intact   Pulmonary neg sleep apnea, neg COPD, former smoker,    breath sounds clear to auscultation       Cardiovascular (-) angina(-) Past MI and (-) Cardiac Stents negative cardio ROS  (-) dysrhythmias  Rhythm:regular Rate:Normal     Neuro/Psych negative neurological ROS  negative psych ROS   GI/Hepatic negative GI ROS, Neg liver ROS,   Endo/Other  Hypothyroidism Morbid obesity  Renal/GU negative Renal ROS  negative genitourinary   Musculoskeletal   Abdominal   Peds  Hematology negative hematology ROS (+)   Anesthesia Other Findings Past Medical History: No date: Anemia     Comment:  pregnancy only No date: Complication of anesthesia No date: Hypothyroidism     Comment:  h/o thyroidectomy 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 No date: Malignant neoplasm of thyroid gland ([place]) 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[place]) No date: Pneumonia     Comment:  08/2020 No date: PONV (postoperative nausea and vomiting)  Past Surgical History: No date: BILATERAL SALPINGECTOMY 07/2012: COLPOSCOPY     Comment:  neg 12/18/2020: DILATATION AND CURETTAGE/HYSTEROSCOPY WITH MINERVA; N/A     Comment:  Procedure: DILATATION AND CURETTAGE/HYSTEROSCOPY WITH               MINERVA;  Surgeon: Malachy Mood, MD;  Location: [facility]              ORS;  Service: Gynecology;  Laterality: N/A; 02/03/2017: LAPAROSCOPIC TUBAL LIGATION; N/A     Comment:  Procedure: LAPAROSCOPIC TUBAL LIGATION via               SALPINGECTOMY;  Surgeon: Malachy Mood, MD;  Location:              [facility] ORS;  Service: Gynecology;  Laterality: N/A; 06/25/2015: THYROIDECTOMY;  N/A     Comment:  Procedure: Total thyroidectomy with laryngeal nerve               monitoring ;  Surgeon: Clyde Canterbury, MD;  Location: [facility]               ORS;  Service: ENT;  Laterality: N/A; 02/03/2017: TUBAL LIGATION No date: WISDOM TOOTH EXTRACTION     Reproductive/Obstetrics negative OB ROS                            Anesthesia Physical Anesthesia Plan  ASA: III  Anesthesia Plan: General   Post-op Pain Management:    Induction:   PONV Risk Score and Plan: Propofol infusion and TIVA  Airway Management Planned: Nasal Cannula  Additional Equipment:   Intra-op Plan:   Post-operative Plan:   Informed Consent: I have reviewed the patients History and Physical, chart, labs and discussed the procedure including the risks, benefits and alternatives for the proposed anesthesia with the patient or authorized representative who has indicated his/her understanding and acceptance.     Dental Advisory Given  Plan Discussed with: Anesthesiologist, CRNA and Surgeon  Anesthesia Plan Comments:        Anesthesia Quick Evaluation

## 2021-01-16 LAB — SURGICAL PATHOLOGY

## 2021-01-23 ENCOUNTER — Encounter: Payer: Self-pay | Admitting: Gastroenterology

## 2021-01-27 ENCOUNTER — Encounter: Payer: Self-pay | Admitting: Obstetrics and Gynecology

## 2021-01-27 ENCOUNTER — Ambulatory Visit (INDEPENDENT_AMBULATORY_CARE_PROVIDER_SITE_OTHER): Payer: No Typology Code available for payment source | Admitting: Obstetrics and Gynecology

## 2021-01-27 ENCOUNTER — Other Ambulatory Visit: Payer: Self-pay

## 2021-01-27 VITALS — BP 120/72 | Wt 255.0 lb

## 2021-01-27 DIAGNOSIS — Z4889 Encounter for other specified surgical aftercare: Secondary | ICD-10-CM

## 2021-01-27 DIAGNOSIS — Z1231 Encounter for screening mammogram for malignant neoplasm of breast: Secondary | ICD-10-CM

## 2021-01-27 NOTE — Progress Notes (Signed)
      Postoperative Follow-up Patient presents post op from hysteroscopy, Minerva endometrial ablation 6weeks ago for abnormal uterine bleeding.  Subjective: Patient reports marked improvement in her preop symptoms. Eating a regular diet without difficulty. The patient is not having any pain.   Objective: Blood pressure 120/72, weight 255 lb (115.7 kg), last menstrual period 01/02/2021, currently breastfeeding.  General: NAD Pulmonary: no increased work of breathing GU: normal external female genitalia normal cervix, no CMT, uterus normal in shape and contour, no adnexal tenderness or masses Extremities: no edema Neurologic: normal gait    Admission on 01/15/2021, Discharged on 01/15/2021  Component Date Value Ref Range Status  . Preg Test, Ur 01/15/2021 NEGATIVE  NEGATIVE Final   Comment:        THE SENSITIVITY OF THIS METHODOLOGY IS >24 mIU/mL   . SURGICAL PATHOLOGY 01/15/2021    Final-Edited                   Value:SURGICAL PATHOLOGY CASE: [id number] PATIENT: Aileen Pilot Surgical Pathology Report     Specimen Submitted: A. Colon polyp x3, trans/sig; cold snare  Clinical History: Screening colonoscopy.  Polyps; diverticulosis      DIAGNOSIS: A. COLON POLYP X3, TRANSVERSE AND SIGMOID; COLD SNARE: - TUBULAR ADENOMA (1); NEGATIVE FOR HIGH-GRADE DYSPLASIA AND MALIGNANCY. - SESSILE SERRATED POLYP (1); NEGATIVE FOR DYSPLASIA AND MALIGNANCY. - HYPERPLASTIC POLYP (1); NEGATIVE FOR DYSPLASIA AND MALIGNANCY.  GROSS DESCRIPTION: A. Labeled: Cold snare transverse/sigmoid colon polyp x3 Received: Formalin Collection time: 7:51 AM on 01/15/2021 Placed into formalin time: 7:51 AM on 01/15/2021 Tissue fragment(s): Multiple Size: Aggregate, 2.4 x 0.7 x 0.2 cm Description: Received are fragments of tan-pink soft tissue admixed with intestinal debris.  The ratio of soft tissue to intestinal debris is 60: 40. Entirely submitted in 1 cassette.  RB 01/15/2021  Final  Diagnosis perf                         ormed by Quay Burow, MD.   Electronically signed 01/16/2021 10:07:51AM The electronic signature indicates that the named Attending Pathologist has evaluated the specimen Technical component performed at [facility], [address] [place], [place] [address] Lab: [phone] Dir: Rush Farmer, MD, MMM  Professional component performed at [facility], [facility], [place], [place], [place] [address] Lab: [phone] Dir: Dellia Nims. Reuel Derby, MD     Assessment: 40 y.o. s/p hysteroscopy, Minerva endometrial ablation stable  Plan: Patient has done well after surgery with no apparent complications.  I have discussed the post-operative course to date, and the expected progress moving forward.  The patient understands what complications to be concerned about.  I will see the patient in routine follow up, or sooner if needed.    Activity plan: No restriction.  Mammogram ordered  Return in about 1 year (around 01/27/2022) for annual.    Malachy Mood, MD, [place], [facility] 01/27/2021, 8:16 AM

## 2021-01-27 NOTE — Patient Instructions (Signed)
[facility] [address] [place] [address]  [facility] [place]  [address] [place] [address]  Phone: [phone]  

## 2021-04-23 ENCOUNTER — Other Ambulatory Visit: Payer: Self-pay

## 2021-04-23 MED FILL — Levothyroxine Sodium Tab 150 MCG: ORAL | 84 days supply | Qty: 78 | Fill #0 | Status: AC

## 2021-08-20 ENCOUNTER — Other Ambulatory Visit: Payer: Self-pay

## 2021-08-20 MED ORDER — LEVOTHYROXINE SODIUM 150 MCG PO TABS
ORAL_TABLET | ORAL | 0 refills | Status: DC
  Filled 2021-08-20: qty 90, 90d supply, fill #0

## 2021-09-14 ENCOUNTER — Encounter: Payer: Self-pay | Admitting: Physician Assistant

## 2021-09-14 ENCOUNTER — Encounter: Payer: Self-pay | Admitting: Family Medicine

## 2021-09-16 ENCOUNTER — Ambulatory Visit (INDEPENDENT_AMBULATORY_CARE_PROVIDER_SITE_OTHER): Payer: No Typology Code available for payment source | Admitting: Family Medicine

## 2021-09-16 ENCOUNTER — Other Ambulatory Visit: Payer: Self-pay

## 2021-09-16 ENCOUNTER — Encounter: Payer: Self-pay | Admitting: Family Medicine

## 2021-09-16 VITALS — BP 131/83 | HR 66 | Resp 16 | Ht 66.0 in | Wt 261.4 lb

## 2021-09-16 DIAGNOSIS — R790 Abnormal level of blood mineral: Secondary | ICD-10-CM

## 2021-09-16 DIAGNOSIS — R002 Palpitations: Secondary | ICD-10-CM

## 2021-09-16 DIAGNOSIS — Z1159 Encounter for screening for other viral diseases: Secondary | ICD-10-CM

## 2021-09-16 DIAGNOSIS — Z131 Encounter for screening for diabetes mellitus: Secondary | ICD-10-CM

## 2021-09-16 DIAGNOSIS — R748 Abnormal levels of other serum enzymes: Secondary | ICD-10-CM

## 2021-09-16 DIAGNOSIS — Z0000 Encounter for general adult medical examination without abnormal findings: Secondary | ICD-10-CM

## 2021-09-16 DIAGNOSIS — Z13220 Encounter for screening for lipoid disorders: Secondary | ICD-10-CM

## 2021-09-16 DIAGNOSIS — Z1231 Encounter for screening mammogram for malignant neoplasm of breast: Secondary | ICD-10-CM | POA: Diagnosis not present

## 2021-09-16 DIAGNOSIS — Z136 Encounter for screening for cardiovascular disorders: Secondary | ICD-10-CM

## 2021-09-16 DIAGNOSIS — E559 Vitamin D deficiency, unspecified: Secondary | ICD-10-CM

## 2021-09-16 DIAGNOSIS — C73 Malignant neoplasm of thyroid gland: Secondary | ICD-10-CM

## 2021-09-16 DIAGNOSIS — R5382 Chronic fatigue, unspecified: Secondary | ICD-10-CM | POA: Insufficient documentation

## 2021-09-16 NOTE — Assessment & Plan Note (Signed)
Hx of obesity- has been working with trainer and following modified lower carb diet with little/no results

## 2021-09-16 NOTE — Assessment & Plan Note (Signed)
Due for mammo

## 2021-09-16 NOTE — Assessment & Plan Note (Signed)
Screening given obesity

## 2021-09-16 NOTE — Progress Notes (Signed)
Complete physical exam   Patient: Brooke Gordon   DOB: 14-Apr-1981   40 y.o. Female  MRN: [id number] Visit Date: 09/16/2021  Today's healthcare provider: Gwyneth Sprout, FNP   Chief Complaint  Patient presents with   Annual Exam   Subjective    Brooke Gordon is a 40 y.o. female who presents today for a complete physical exam.  She reports consuming a general diet. Home exercise routine includes working with personal trainer 4x a week. She generally feels well. She reports sleeping fairly well. She does have additional problems to discuss today, patient reports edema in her legs for 6 months, she reports history of elevated blood pressure.  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   Pneumonia    08/2020   PONV (postoperative nausea and vomiting)    Past Surgical History:  Procedure Laterality Date   BILATERAL SALPINGECTOMY     COLONOSCOPY WITH PROPOFOL N/A 01/15/2021   Procedure: COLONOSCOPY WITH PROPOFOL;  Surgeon: Virgel Manifold, MD;  Location: [facility] ENDOSCOPY;  Service: Gastroenterology;  Laterality: N/A;   COLPOSCOPY  07/2012   neg   DILATATION AND CURETTAGE/HYSTEROSCOPY WITH MINERVA N/A 12/18/2020   Procedure: DILATATION AND CURETTAGE/HYSTEROSCOPY WITH MINERVA;  Surgeon: Malachy Mood, MD;  Location: [facility] ORS;  Service: Gynecology;  Laterality: N/A;   LAPAROSCOPIC TUBAL LIGATION N/A 02/03/2017   Procedure: LAPAROSCOPIC TUBAL LIGATION via SALPINGECTOMY;  Surgeon: Malachy Mood, MD;  Location: [facility] ORS;  Service: Gynecology;  Laterality: N/A;   THYROIDECTOMY N/A 06/25/2015   Procedure: Total thyroidectomy with laryngeal nerve monitoring ;  Surgeon: Clyde Canterbury, MD;  Location: [facility] ORS;  Service: ENT;  Laterality: N/A;   TUBAL LIGATION  02/03/2017   WISDOM  TOOTH EXTRACTION     Social History   Socioeconomic History   Marital status: Married    Spouse name: Not on file   Number of children: 5   Years of education: Not on file   Highest education level: Not on file  Occupational History   Not on file  Tobacco Use   Smoking status: Former    Packs/day: 0.25    Years: 10.00    Pack years: 2.50    Types: Cigarettes    Quit date: 06/23/2008    Years since quitting: 13.2   Smokeless tobacco: Never  Vaping Use   Vaping Use: Never used  Substance and Sexual Activity   Alcohol use: Yes    Comment: very rare   Drug use: No   Sexual activity: Yes    Birth control/protection: Surgical  Other Topics Concern   Not on file  Social History Narrative   Not on file   Social Determinants of Health   Financial Resource Strain: Not on file  Food Insecurity: Not on file  Transportation Needs: Not on file  Physical Activity: Not on file  Stress: Not on file  Social Connections: Not on file  Intimate Partner Violence: Not on file   Family Status  Relation Name Status   Mother  Alive   Father  Alive   Other Anesha Hackert Other       Thyroid CA 2016   Family History  Problem Relation Age of Onset   Cancer  Other    Allergies  Allergen Reactions   Codeine Other (See Comments)    Childhood reaction  Reaction:  Unknown     Patient Care Team: Gwyneth Sprout, FNP as PCP - General (Family Medicine)   Medications: Outpatient Medications Prior to Visit  Medication Sig   levothyroxine (SYNTHROID) 150 MCG tablet TAKE 1 TABLET BY MOUTH 6 DAYS PER WEEK   [DISCONTINUED] ascorbic acid (VITAMIN C) 500 MG tablet Take 500 mg by mouth daily.   [DISCONTINUED] Cholecalciferol 25 MCG (1000 UT) capsule Take 1,000 Units by mouth daily.   [DISCONTINUED] ibuprofen (ADVIL) 600 MG tablet TAKE 1 TABLET BY MOUTH EVERY 6 HOURS AS NEEDED. (Patient not taking: Reported on 01/15/2021)   [DISCONTINUED] levothyroxine (SYNTHROID) 150 MCG tablet TAKE 1 TABLET BY  MOUTH ONCE DAILY ON AN EMPTY STOMACH WITH A GLASS OF WATER AT LEAST 30-60 MINUTES BEFORE BREAKFAST.   [DISCONTINUED] levothyroxine (SYNTHROID) 150 MCG tablet Take 1 tablet by mouth 6 days per week   [DISCONTINUED] levothyroxine (SYNTHROID, LEVOTHROID) 150 MCG tablet Take 150 mcg by mouth daily before breakfast.   [DISCONTINUED] Multiple Vitamin (MULTIVITAMIN) capsule Take 1 capsule by mouth daily.   [DISCONTINUED] ondansetron (ZOFRAN-ODT) 4 MG disintegrating tablet DISSOLVE 1 TABLET (4 MG TOTAL) BY MOUTH EVERY 6 (SIX) HOURS AS NEEDED FOR NAUSEA.   [DISCONTINUED] zinc gluconate 50 MG tablet Take 50 mg by mouth daily.   Facility-Administered Medications Prior to Visit  Medication Dose Route Frequency Provider   bupivacaine (SENSORCAINE-MPF) 0.75 % injection    Anesthesia Intra-op Gunnar Bulla, MD    Review of Systems  Cardiovascular:  Positive for leg swelling.  All other systems reviewed and are negative.    Objective    BP 131/83   Pulse 66   Resp 16   Ht 5\' 6"  (1.676 m)   Wt 261 lb 6.4 oz (118.6 kg)   LMP 08/25/2021 (Exact Date)   SpO2 98%   BMI 42.19 kg/m    Physical Exam Vitals and nursing note reviewed.  Constitutional:      General: She is awake. She is not in acute distress.    Appearance: Normal appearance. She is well-developed and well-groomed. She is obese. She is not ill-appearing, toxic-appearing or diaphoretic.  HENT:     Head: Normocephalic and atraumatic.     Jaw: There is normal jaw occlusion. No trismus, tenderness, swelling or pain on movement.     Right Ear: Hearing, tympanic membrane, ear canal and external ear normal. There is no impacted cerumen.     Left Ear: Hearing, tympanic membrane, ear canal and external ear normal. There is no impacted cerumen.     Nose: Nose normal. No congestion or rhinorrhea.     Right Turbinates: Not enlarged, swollen or pale.     Left Turbinates: Not enlarged, swollen or pale.     Right Sinus: No maxillary sinus  tenderness or frontal sinus tenderness.     Left Sinus: No maxillary sinus tenderness or frontal sinus tenderness.     Mouth/Throat:     Lips: Pink.     Mouth: Mucous membranes are moist. No injury.     Tongue: No lesions.     Pharynx: Oropharynx is clear. Uvula midline. No pharyngeal swelling, oropharyngeal exudate, posterior oropharyngeal erythema or uvula swelling.     Tonsils: No tonsillar exudate or tonsillar abscesses.  Eyes:     General: Lids are normal. Lids are everted, no foreign bodies appreciated. Vision grossly intact. Gaze aligned appropriately. No allergic  shiner or visual field deficit.       Right eye: No discharge.        Left eye: No discharge.     Extraocular Movements: Extraocular movements intact.     Conjunctiva/sclera: Conjunctivae normal.     Right eye: Right conjunctiva is not injected. No exudate.    Left eye: Left conjunctiva is not injected. No exudate.    Pupils: Pupils are equal, round, and reactive to light.  Neck:     Thyroid: No thyroid mass, thyromegaly or thyroid tenderness.     Vascular: No carotid bruit.     Trachea: Trachea normal.  Cardiovascular:     Rate and Rhythm: Normal rate. Rhythm irregular.     Pulses: Normal pulses.          Carotid pulses are 2+ on the right side and 2+ on the left side.      Radial pulses are 2+ on the right side and 2+ on the left side.       Dorsalis pedis pulses are 2+ on the right side and 2+ on the left side.       Posterior tibial pulses are 2+ on the right side and 2+ on the left side.     Heart sounds: Normal heart sounds, S1 normal and S2 normal. No murmur heard.   No friction rub. No gallop.     Comments: Likely PACs; normal EKG Pulmonary:     Effort: Pulmonary effort is normal. No respiratory distress.     Breath sounds: Normal breath sounds and air entry. No stridor. No wheezing, rhonchi or rales.  Chest:     Chest wall: No tenderness.     Comments: Breast exam deferred; discussed 'know your lemons'  campaign and self exam Abdominal:     General: Abdomen is flat. Bowel sounds are normal. There is no distension.     Palpations: Abdomen is soft. There is no mass.     Tenderness: There is no abdominal tenderness. There is no right CVA tenderness, left CVA tenderness, guarding or rebound.     Hernia: No hernia is present.  Genitourinary:    Comments: Exam deferred; denies complaints Musculoskeletal:        General: No swelling, tenderness, deformity or signs of injury. Normal range of motion.     Cervical back: Full passive range of motion without pain, normal range of motion and neck supple. No edema, rigidity or tenderness. No muscular tenderness.     Right lower leg: No edema.     Left lower leg: No edema.  Lymphadenopathy:     Cervical: No cervical adenopathy.     Right cervical: No superficial, deep or posterior cervical adenopathy.    Left cervical: No superficial, deep or posterior cervical adenopathy.  Skin:    General: Skin is warm and dry.     Capillary Refill: Capillary refill takes less than 2 seconds.     Coloration: Skin is not jaundiced or pale.     Findings: No bruising, erythema, lesion or rash.  Neurological:     General: No focal deficit present.     Mental Status: She is alert and oriented to person, place, and time. Mental status is at baseline.     GCS: GCS eye subscore is 4. GCS verbal subscore is 5. GCS motor subscore is 6.     Sensory: Sensation is intact. No sensory deficit.     Motor: Motor function is intact. No weakness.  Coordination: Coordination is intact. Coordination normal.     Gait: Gait is intact. Gait normal.  Psychiatric:        Attention and Perception: Attention and perception normal.        Mood and Affect: Mood and affect normal.        Speech: Speech normal.        Behavior: Behavior normal. Behavior is cooperative.        Thought Content: Thought content normal.        Cognition and Memory: Cognition and memory normal.         Judgment: Judgment normal.    Last depression screening scores PHQ 2/9 Scores 09/16/2021 09/05/2020 07/05/2018  PHQ - 2 Score 0 0 0  PHQ- 9 Score 0 - 0   Last fall risk screening Fall Risk  07/05/2018  Falls in the past year? No   Last Audit-C alcohol use screening Alcohol Use Disorder Test (AUDIT) 09/16/2021  1. How often do you have a drink containing alcohol? 3  2. How many drinks containing alcohol do you have on a typical day when you are drinking? 0  3. How often do you have six or more drinks on one occasion? 1  AUDIT-C Score 4  Alcohol Brief Interventions/Follow-up -   A score of 3 or more in women, and 4 or more in men indicates increased risk for alcohol abuse, EXCEPT if all of the points are from question 1   No results found for any visits on 09/16/21.  Assessment & Plan    Routine Health Maintenance and Physical Exam  Exercise Activities and Dietary recommendations  Goals   None     Immunization History  Administered Date(s) Administered   Influenza-Unspecified 06/29/2018   PFIZER(Purple Top)SARS-COV-2 Vaccination 06/26/2020, 08/01/2020   Tdap 07/25/2018    Health Maintenance  Topic Date Due   Pneumococcal Vaccine 74-9 Years old (1 - PCV) Never done   Hepatitis C Screening  Never done   COVID-19 Vaccine (3 - Pfizer risk series) 08/29/2020   INFLUENZA VACCINE  05/18/2021   PAP SMEAR-Modifier  10/16/2023   COLONOSCOPY (Pts 45-34yrs Insurance coverage will need to be confirmed)  01/15/2026   TETANUS/TDAP  07/25/2028   HIV Screening  Completed   HPV VACCINES  Aged Out    Discussed health benefits of physical activity, and encouraged her to engage in regular exercise appropriate for her age and condition.  Problem List Items Addressed This Visit       Endocrine   Thyroid cancer ([place])    Followed by endo at [facility]; advised pt that we can follow here if preferred        Other   Vitamin D deficiency    Repeat labs; recommend diet/supplementation if  labs indicate      Relevant Orders   Vitamin D (25 hydroxy)   Elevated alkaline phosphatase level    Repeat chemistry      Relevant Orders   Comprehensive metabolic panel   Screening mammogram, encounter for    Due for mammo      Relevant Orders   MM 3D SCREEN BREAST BILATERAL   Screening for diabetes mellitus    Hx of obesity- has been working with trainer and following modified lower carb diet with little/no results      Relevant Orders   Hemoglobin A1c   Encounter for lipid screening for cardiovascular disease    Screening given obesity      Relevant Orders   Lipid  panel   Annual physical exam - Primary    UTD on dental/eye exams Things to do to keep yourself healthy  - Exercise at least 30-45 minutes a day, 3-4 days a week.  - Eat a low-fat diet with lots of fruits and vegetables, up to 7-9 servings per day.  - Seatbelts can save your life. Wear them always.  - Smoke detectors on every level of your home, check batteries every year.  - Eye Doctor - have an eye exam every 1-2 years  - Safe sex - if you may be exposed to STDs, use a condom.  - Alcohol -  If you drink, do it moderately, less than 2 drinks per day.  - [place]. Choose someone to speak for you if you are not able.  - Depression is common in our stressful world.If you're feeling down or losing interest in things you normally enjoy, please come in for a visit.  - Violence - If anyone is threatening or hurting you, please call immediately.        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     Vonna Kotyk, FNP, have reviewed all documentation for this visit. The documentation on 09/16/21 for the exam, diagnosis, procedures, and orders are all accurate and complete.    Gwyneth Sprout, [place] [phone] (phone) [phone] (fax)  [place]

## 2021-09-16 NOTE — Assessment & Plan Note (Signed)
UTD on dental/eye exams Things to do to keep yourself healthy  - Exercise at least 30-45 minutes a day, 3-4 days a week.  - Eat a low-fat diet with lots of fruits and vegetables, up to 7-9 servings per day.  - Seatbelts can save your life. Wear them always.  - Smoke detectors on every level of your home, check batteries every year.  - Eye Doctor - have an eye exam every 1-2 years  - Safe sex - if you may be exposed to STDs, use a condom.  - Alcohol -  If you drink, do it moderately, less than 2 drinks per day.  - [place]. Choose someone to speak for you if you are not able.  - Depression is common in our stressful world.If you're feeling down or losing interest in things you normally enjoy, please come in for a visit.  - Violence - If anyone is threatening or hurting you, please call immediately.

## 2021-09-16 NOTE — Assessment & Plan Note (Signed)
Followed by endo at [facility]; advised pt that we can follow here if preferred

## 2021-09-16 NOTE — Assessment & Plan Note (Signed)
Repeat labs; recommend diet/supplementation if labs indicate

## 2021-09-16 NOTE — Assessment & Plan Note (Signed)
Likely PACs; heart on exam- normal EKG Discussed likely causes- hold off on referral at this time

## 2021-09-16 NOTE — Assessment & Plan Note (Signed)
Repeat chemistry

## 2021-09-16 NOTE — Assessment & Plan Note (Signed)
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

## 2021-09-19 LAB — LIPID PANEL
Chol/HDL Ratio: 3.1 ratio (ref 0.0–4.4)
Cholesterol, Total: 210 mg/dL — ABNORMAL HIGH (ref 100–199)
HDL: 68 mg/dL (ref >39)
LDL Chol Calc (NIH): 128 mg/dL — ABNORMAL HIGH (ref 0–99)
Triglycerides: 81 mg/dL (ref 0–149)
VLDL Cholesterol Cal: 14 mg/dL (ref 5–40)

## 2021-09-19 LAB — COMPREHENSIVE METABOLIC PANEL
ALT: 16 IU/L (ref 0–32)
AST: 14 IU/L (ref 0–40)
Albumin/Globulin Ratio: 1.8 (ref 1.2–2.2)
Albumin: 4.6 g/dL (ref 3.8–4.8)
Alkaline Phosphatase: 115 IU/L (ref 44–121)
BUN/Creatinine Ratio: 14 (ref 9–23)
BUN: 12 mg/dL (ref 6–24)
Bilirubin Total: 0.4 mg/dL (ref 0.0–1.2)
CO2: 22 mmol/L (ref 20–29)
Calcium: 9.2 mg/dL (ref 8.7–10.2)
Chloride: 103 mmol/L (ref 96–106)
Creatinine, Ser: 0.83 mg/dL (ref 0.57–1.00)
Globulin, Total: 2.6 g/dL (ref 1.5–4.5)
Glucose: 94 mg/dL (ref 70–99)
Potassium: 4.6 mmol/L (ref 3.5–5.2)
Sodium: 139 mmol/L (ref 134–144)
Total Protein: 7.2 g/dL (ref 6.0–8.5)
eGFR: 90 mL/min/{1.73_m2} (ref >59)

## 2021-09-19 LAB — HEPATITIS C ANTIBODY: Hep C Virus Ab: <0.1 s/co ratio (ref 0.0–0.9)

## 2021-09-19 LAB — HEMOGLOBIN A1C
Est. average glucose Bld gHb Est-mCnc: 123 mg/dL
Hgb A1c MFr Bld: 5.9 % — ABNORMAL HIGH (ref 4.8–5.6)

## 2021-09-19 LAB — IRON,TIBC AND FERRITIN PANEL
Ferritin: 25 ng/mL (ref 15–150)
Iron Saturation: 35 % (ref 15–55)
Iron: 126 ug/dL (ref 27–159)
Total Iron Binding Capacity: 355 ug/dL (ref 250–450)
UIBC: 229 ug/dL (ref 131–425)

## 2021-09-19 LAB — VITAMIN D 25 HYDROXY (VIT D DEFICIENCY, FRACTURES): Vit D, 25-Hydroxy: 25.4 ng/mL — ABNORMAL LOW (ref 30.0–100.0)

## 2021-10-21 ENCOUNTER — Ambulatory Visit: Payer: No Typology Code available for payment source | Admitting: Family Medicine

## 2021-11-10 ENCOUNTER — Other Ambulatory Visit: Payer: Self-pay

## 2021-11-10 MED ORDER — LEVOTHYROXINE SODIUM 175 MCG PO TABS
ORAL_TABLET | ORAL | 1 refills | Status: DC
  Filled 2021-11-10: qty 90, 90d supply, fill #0
  Filled 2022-04-15: qty 90, 90d supply, fill #1

## 2021-12-10 ENCOUNTER — Ambulatory Visit: Payer: No Typology Code available for payment source | Admitting: Family Medicine

## 2022-04-16 ENCOUNTER — Other Ambulatory Visit: Payer: Self-pay

## 2022-09-14 ENCOUNTER — Other Ambulatory Visit: Payer: Self-pay

## 2022-09-15 ENCOUNTER — Other Ambulatory Visit: Payer: Self-pay

## 2022-09-15 MED ORDER — LEVOTHYROXINE SODIUM 175 MCG PO TABS
ORAL_TABLET | ORAL | 0 refills | Status: DC
  Filled 2022-09-15: qty 90, 90d supply, fill #0

## 2022-11-29 ENCOUNTER — Other Ambulatory Visit: Payer: Self-pay

## 2022-11-29 ENCOUNTER — Ambulatory Visit (INDEPENDENT_AMBULATORY_CARE_PROVIDER_SITE_OTHER): Payer: 59 | Admitting: Family Medicine

## 2022-11-29 ENCOUNTER — Encounter: Payer: Self-pay | Admitting: Family Medicine

## 2022-11-29 VITALS — BP 139/89 | HR 76 | Ht 66.0 in | Wt 270.2 lb

## 2022-11-29 DIAGNOSIS — Z1231 Encounter for screening mammogram for malignant neoplasm of breast: Secondary | ICD-10-CM | POA: Insufficient documentation

## 2022-11-29 DIAGNOSIS — R0609 Other forms of dyspnea: Secondary | ICD-10-CM | POA: Diagnosis not present

## 2022-11-29 DIAGNOSIS — E890 Postprocedural hypothyroidism: Secondary | ICD-10-CM

## 2022-11-29 DIAGNOSIS — R7303 Prediabetes: Secondary | ICD-10-CM | POA: Diagnosis not present

## 2022-11-29 DIAGNOSIS — E559 Vitamin D deficiency, unspecified: Secondary | ICD-10-CM

## 2022-11-29 DIAGNOSIS — Z0000 Encounter for general adult medical examination without abnormal findings: Secondary | ICD-10-CM

## 2022-11-29 MED ORDER — METFORMIN HCL ER 750 MG PO TB24
750.0000 mg | ORAL_TABLET | Freq: Every day | ORAL | 11 refills | Status: DC
  Filled 2022-11-29 – 2023-02-11 (×2): qty 30, 30d supply, fill #0
  Filled 2023-03-22: qty 30, 30d supply, fill #1

## 2022-11-29 MED ORDER — BUPROPION HCL ER (XL) 150 MG PO TB24
150.0000 mg | ORAL_TABLET | Freq: Every day | ORAL | 11 refills | Status: DC
  Filled 2022-11-29 – 2023-02-11 (×2): qty 30, 30d supply, fill #0
  Filled 2023-03-22: qty 30, 30d supply, fill #1

## 2022-11-29 MED ORDER — LOSARTAN POTASSIUM-HCTZ 50-12.5 MG PO TABS
1.0000 | ORAL_TABLET | Freq: Every day | ORAL | 11 refills | Status: DC
  Filled 2022-11-29 – 2023-02-11 (×2): qty 30, 30d supply, fill #0

## 2022-11-29 NOTE — Patient Instructions (Signed)
Please call and schedule your mammogram:  [facility]  [place], [place],  [place]  [address] Get Driving Directions Main: [phone]  Sunday:Closed Monday:7:20 AM - 5:00 PM Tuesday:7:20 AM - 5:00 PM Wednesday:7:20 AM - 5:00 PM Thursday:7:20 AM - 5:00 PM Friday:7:20 AM - 4:30 PM Saturday:Closed

## 2022-11-29 NOTE — Assessment & Plan Note (Signed)
Encourage annual dental and vision Orders placed for mammogram PAP due later this year UTD on colon cancer screening Things to do to keep yourself healthy  - Exercise at least 30-45 minutes a day, 3-4 days a week.  - Eat a low-fat diet with lots of fruits and vegetables, up to 7-9 servings per day.  - Seatbelts can save your life. Wear them always.  - Smoke detectors on every level of your home, check batteries every year.  - Eye Doctor - have an eye exam every 1-2 years  - Safe sex - if you may be exposed to STDs, use a condom.  - Alcohol -  If you drink, do it moderately, less than 2 drinks per day.  - [place]. Choose someone to speak for you if you are not able.  - Depression is common in our stressful world.If you're feeling down or losing interest in things you normally enjoy, please come in for a visit.  - Violence - If anyone is threatening or hurting you, please call immediately.

## 2022-11-29 NOTE — Assessment & Plan Note (Signed)
Chronic, previously controlled with diet/exercise Repeat A1c Continue to recommend balanced, lower carb meals. Smaller meal size, adding snacks. Choosing water as drink of choice and increasing purposeful exercise.

## 2022-11-29 NOTE — Assessment & Plan Note (Signed)
Chronic, previously low despite OTC supplement Notes obesity, difficult to maintain substantial weight loss Repeat labs

## 2022-11-29 NOTE — Assessment & Plan Note (Signed)
Chronic, stable Notes with climbing stairs or with talking when walking  Concerned for heart involvement given +1 BLE, pitting on exam Referral placed for CT scan, coronary calcium- self pay Trial of medication to assist

## 2022-11-29 NOTE — Assessment & Plan Note (Signed)
Chronic, previously stable on 175 mcg Repeat labs Pt notes difficulty losing weight

## 2022-11-29 NOTE — Assessment & Plan Note (Signed)
Due for screening for mammogram, denies breast concerns, provided with phone number to call and schedule appointment for mammogram. Encouraged to repeat breast cancer screening every 1-2 years.  

## 2022-11-29 NOTE — Progress Notes (Signed)
Established patient visit  Patient: Brooke Gordon   DOB: January 27, 1981   42 y.o. Female  MRN: [id number] Visit Date: 11/29/2022  Today's healthcare provider: Gwyneth Sprout, FNP  Introduced to nurse practitioner role and practice setting.  All questions answered.  Discussed provider/patient relationship and expectations.  Subjective    HPI   Here for CPE; notes concerning regarding being at heaviest weight and does not have much appetite.  Medications: Outpatient Medications Prior to Visit  Medication Sig   levothyroxine (SYNTHROID) 175 MCG tablet Take 1 tablet (175 mcg total) by mouth once daily. Take on an empty stomach with a glass of water at least 30-60 minutes before breakfast.   levothyroxine (SYNTHROID) 175 MCG tablet Take 1 tablet (175 mcg total) by mouth once daily Take on an empty stomach with a glass of water at least 30-60 minutes before breakfast.   levothyroxine (SYNTHROID) 150 MCG tablet TAKE 1 TABLET BY MOUTH 6 DAYS PER WEEK   Facility-Administered Medications Prior to Visit  Medication Dose Route Frequency Provider   bupivacaine (SENSORCAINE-MPF) 0.75 % injection    Anesthesia Intra-op Gunnar Bulla, MD    Review of Systems    Objective    BP 139/89   Pulse 76   Ht 5' 6"$  (1.676 m)   Wt 270 lb 3.2 oz (122.6 kg)   LMP 11/12/2022   SpO2 98%   BMI 43.61 kg/m   Physical Exam Vitals and nursing note reviewed.  Constitutional:      General: She is awake. She is not in acute distress.    Appearance: Normal appearance. She is well-developed and well-groomed. She is obese. She is not ill-appearing, toxic-appearing or diaphoretic.  HENT:     Head: Normocephalic and atraumatic.     Jaw: There is normal jaw occlusion. No trismus, tenderness, swelling or pain on movement.     Right Ear: Hearing, tympanic membrane, ear canal and external ear normal. There is no impacted cerumen.     Left Ear: Hearing, tympanic membrane, ear canal and external ear normal. There is  no impacted cerumen.     Nose: Nose normal. No congestion or rhinorrhea.     Right Turbinates: Not enlarged, swollen or pale.     Left Turbinates: Not enlarged, swollen or pale.     Right Sinus: No maxillary sinus tenderness or frontal sinus tenderness.     Left Sinus: No maxillary sinus tenderness or frontal sinus tenderness.     Mouth/Throat:     Lips: Pink.     Mouth: Mucous membranes are moist. No injury.     Tongue: No lesions.     Pharynx: Oropharynx is clear. Uvula midline. No pharyngeal swelling, oropharyngeal exudate, posterior oropharyngeal erythema or uvula swelling.     Tonsils: No tonsillar exudate or tonsillar abscesses.  Eyes:     General: Lids are normal. Lids are everted, no foreign bodies appreciated. Vision grossly intact. Gaze aligned appropriately. No allergic shiner or visual field deficit.       Right eye: No discharge.        Left eye: No discharge.     Extraocular Movements: Extraocular movements intact.     Conjunctiva/sclera: Conjunctivae normal.     Right eye: Right conjunctiva is not injected. No exudate.    Left eye: Left conjunctiva is not injected. No exudate.    Pupils: Pupils are equal, round, and reactive to light.  Neck:     Thyroid: No thyroid mass, thyromegaly or thyroid tenderness.  Vascular: No carotid bruit.     Trachea: Trachea normal.  Cardiovascular:     Rate and Rhythm: Normal rate and regular rhythm.     Pulses: Normal pulses.          Carotid pulses are 2+ on the right side and 2+ on the left side.      Radial pulses are 2+ on the right side and 2+ on the left side.       Dorsalis pedis pulses are 2+ on the right side and 2+ on the left side.       Posterior tibial pulses are 2+ on the right side and 2+ on the left side.     Heart sounds: Normal heart sounds, S1 normal and S2 normal. No murmur heard.    No friction rub. No gallop.  Pulmonary:     Effort: Pulmonary effort is normal. No respiratory distress.     Breath sounds: Normal  breath sounds and air entry. No stridor. No wheezing, rhonchi or rales.  Chest:     Chest wall: No tenderness.  Abdominal:     General: Abdomen is flat. Bowel sounds are normal. There is no distension.     Palpations: Abdomen is soft. There is no mass.     Tenderness: There is no abdominal tenderness. There is no right CVA tenderness, left CVA tenderness, guarding or rebound.     Hernia: No hernia is present.  Genitourinary:    Comments: Exam deferred; denies complaints Musculoskeletal:        General: Swelling present. No tenderness, deformity or signs of injury. Normal range of motion.     Cervical back: Full passive range of motion without pain, normal range of motion and neck supple. No edema, rigidity or tenderness. No muscular tenderness.     Right lower leg: 1+ Pitting Edema present.     Left lower leg: 1+ Pitting Edema present.  Lymphadenopathy:     Cervical: No cervical adenopathy.     Right cervical: No superficial, deep or posterior cervical adenopathy.    Left cervical: No superficial, deep or posterior cervical adenopathy.  Skin:    General: Skin is warm and dry.     Capillary Refill: Capillary refill takes less than 2 seconds.     Coloration: Skin is not jaundiced or pale.     Findings: No bruising, erythema, lesion or rash.  Neurological:     General: No focal deficit present.     Mental Status: She is alert and oriented to person, place, and time. Mental status is at baseline.     GCS: GCS eye subscore is 4. GCS verbal subscore is 5. GCS motor subscore is 6.     Sensory: Sensation is intact. No sensory deficit.     Motor: Motor function is intact. No weakness.     Coordination: Coordination is intact. Coordination normal.     Gait: Gait is intact. Gait normal.  Psychiatric:        Attention and Perception: Attention and perception normal.        Mood and Affect: Mood and affect normal.        Speech: Speech normal.        Behavior: Behavior normal. Behavior is  cooperative.        Thought Content: Thought content normal.        Cognition and Memory: Cognition and memory normal.        Judgment: Judgment normal.     No results found  for any visits on 11/29/22.  Assessment & Plan     Problem List Items Addressed This Visit       Endocrine   Hypothyroidism    Chronic, previously stable on 175 mcg Repeat labs Pt notes difficulty losing weight      Relevant Orders   TSH + free T4     Other   Annual physical exam - Primary    Encourage annual dental and vision Orders placed for mammogram PAP due later this year UTD on colon cancer screening Things to do to keep yourself healthy  - Exercise at least 30-45 minutes a day, 3-4 days a week.  - Eat a low-fat diet with lots of fruits and vegetables, up to 7-9 servings per day.  - Seatbelts can save your life. Wear them always.  - Smoke detectors on every level of your home, check batteries every year.  - Eye Doctor - have an eye exam every 1-2 years  - Safe sex - if you may be exposed to STDs, use a condom.  - Alcohol -  If you drink, do it moderately, less than 2 drinks per day.  - [place]. Choose someone to speak for you if you are not able.  - Depression is common in our stressful world.If you're feeling down or losing interest in things you normally enjoy, please come in for a visit.  - Violence - If anyone is threatening or hurting you, please call immediately.       Relevant Orders   CBC with Differential/Platelet   Comprehensive Metabolic Panel (CMET)   Hemoglobin A1c   Lipid panel   DOE (dyspnea on exertion)    Chronic, stable Notes with climbing stairs or with talking when walking  Concerned for heart involvement given +1 BLE, pitting on exam Referral placed for CT scan, coronary calcium- self pay Trial of medication to assist       Relevant Orders   CT CARDIAC SCORING (SELF PAY ONLY)   Prediabetes    Chronic, previously controlled with  diet/exercise Repeat A1c Continue to recommend balanced, lower carb meals. Smaller meal size, adding snacks. Choosing water as drink of choice and increasing purposeful exercise.       Relevant Orders   Hemoglobin A1c   Screening mammogram for breast cancer    Due for screening for mammogram, denies breast concerns, provided with phone number to call and schedule appointment for mammogram. Encouraged to repeat breast cancer screening every 1-2 years.       Relevant Orders   MM 3D SCREEN BREAST BILATERAL   Vitamin D deficiency    Chronic, previously low despite OTC supplement Notes obesity, difficult to maintain substantial weight loss Repeat labs       Relevant Orders   Vitamin D (25 hydroxy)   Return in about 3 months (around 02/27/2023) for chonic disease management.     Vonna Kotyk, FNP, have reviewed all documentation for this visit. The documentation on 11/29/22 for the exam, diagnosis, procedures, and orders are all accurate and complete.  Gwyneth Sprout, [place] [phone] (phone) [phone] (fax)  [place]

## 2022-11-30 ENCOUNTER — Other Ambulatory Visit: Payer: Self-pay | Admitting: Family Medicine

## 2022-11-30 DIAGNOSIS — E890 Postprocedural hypothyroidism: Secondary | ICD-10-CM

## 2022-11-30 LAB — CBC WITH DIFFERENTIAL/PLATELET
Basophils Absolute: 0.1 10*3/uL (ref 0.0–0.2)
Basos: 1 %
EOS (ABSOLUTE): 0.2 10*3/uL (ref 0.0–0.4)
Eos: 2 %
Hematocrit: 37.7 % (ref 34.0–46.6)
Hemoglobin: 12.3 g/dL (ref 11.1–15.9)
Immature Grans (Abs): 0 10*3/uL (ref 0.0–0.1)
Immature Granulocytes: 0 %
Lymphocytes Absolute: 1.5 10*3/uL (ref 0.7–3.1)
Lymphs: 20 %
MCH: 28.7 pg (ref 26.6–33.0)
MCHC: 32.6 g/dL (ref 31.5–35.7)
MCV: 88 fL (ref 79–97)
Monocytes Absolute: 0.5 10*3/uL (ref 0.1–0.9)
Monocytes: 7 %
Neutrophils Absolute: 5.4 10*3/uL (ref 1.4–7.0)
Neutrophils: 70 %
Platelets: 261 10*3/uL (ref 150–450)
RBC: 4.29 x10E6/uL (ref 3.77–5.28)
RDW: 12.9 % (ref 11.7–15.4)
WBC: 7.7 10*3/uL (ref 3.4–10.8)

## 2022-11-30 LAB — HEMOGLOBIN A1C
Est. average glucose Bld gHb Est-mCnc: 117 mg/dL
Hgb A1c MFr Bld: 5.7 % — ABNORMAL HIGH (ref 4.8–5.6)

## 2022-11-30 LAB — COMPREHENSIVE METABOLIC PANEL
ALT: 14 IU/L (ref 0–32)
AST: 14 IU/L (ref 0–40)
Albumin/Globulin Ratio: 1.7 (ref 1.2–2.2)
Albumin: 4.3 g/dL (ref 3.9–4.9)
Alkaline Phosphatase: 121 IU/L (ref 44–121)
BUN/Creatinine Ratio: 19 (ref 9–23)
BUN: 13 mg/dL (ref 6–24)
Bilirubin Total: 0.2 mg/dL (ref 0.0–1.2)
CO2: 22 mmol/L (ref 20–29)
Calcium: 9.4 mg/dL (ref 8.7–10.2)
Chloride: 104 mmol/L (ref 96–106)
Creatinine, Ser: 0.69 mg/dL (ref 0.57–1.00)
Globulin, Total: 2.6 g/dL (ref 1.5–4.5)
Glucose: 99 mg/dL (ref 70–99)
Potassium: 4.8 mmol/L (ref 3.5–5.2)
Sodium: 139 mmol/L (ref 134–144)
Total Protein: 6.9 g/dL (ref 6.0–8.5)
eGFR: 110 mL/min/{1.73_m2} (ref >59)

## 2022-11-30 LAB — LIPID PANEL
Chol/HDL Ratio: 2.9 ratio (ref 0.0–4.4)
Cholesterol, Total: 179 mg/dL (ref 100–199)
HDL: 62 mg/dL (ref >39)
LDL Chol Calc (NIH): 104 mg/dL — ABNORMAL HIGH (ref 0–99)
Triglycerides: 66 mg/dL (ref 0–149)
VLDL Cholesterol Cal: 13 mg/dL (ref 5–40)

## 2022-11-30 LAB — TSH+FREE T4
Free T4: 1.25 ng/dL (ref 0.82–1.77)
TSH: 18.9 u[IU]/mL — ABNORMAL HIGH (ref 0.450–4.500)

## 2022-11-30 LAB — VITAMIN D 25 HYDROXY (VIT D DEFICIENCY, FRACTURES): Vit D, 25-Hydroxy: 21.1 ng/mL — ABNORMAL LOW (ref 30.0–100.0)

## 2022-11-30 NOTE — Progress Notes (Signed)
Pre-diabetes improved; now 5.7%. Continue to recommend balanced, lower carb meals. Smaller meal size, adding snacks. Choosing water as drink of choice and increasing purposeful exercise.  Cholesterol improved; slight elevation in LDL; however, total at goal. The 10-year ASCVD risk score (Arnett DK, et al., 2019) is: 0.5%   Values used to calculate the score:     Age: 42 years     Sex: Female     Is Non-Hispanic African American: No     Diabetic: No     Tobacco smoker: No     Systolic Blood Pressure: XX123456 mmHg     Is BP treated: No     HDL Cholesterol: 62 mg/dL     Total Cholesterol: 179 mg/dL  TSH is very poorly treated; recommend daily thyroid medication as previously prescribed with water only. Repeat labs in 1 month of consistent use. Try setting an alarm or keeping a few in your bag if you forget.   Vit D is low; recommend 5000 IU daily or weekly Rx supplement to assist. Please let us know your preference.   Cell count and blood chemistry are normal and stable.  Please call for mammogram and follow up if you are unable to schedule your Coronary Calcium CT scan in 1-2 weeks.  Please let us know if you have any questions.  Thank you, Gwyneth Sprout, [address] [place], [place] [address] [phone] (phone) [phone] (fax) [place]

## 2022-12-10 ENCOUNTER — Other Ambulatory Visit: Payer: Self-pay

## 2022-12-14 ENCOUNTER — Ambulatory Visit
Admission: RE | Admit: 2022-12-14 | Discharge: 2022-12-14 | Disposition: A | Discharge status: Home / Self Care | Payer: Self-pay | Source: Ambulatory Visit | Attending: Family Medicine | Admitting: Family Medicine

## 2022-12-14 DIAGNOSIS — R0609 Other forms of dyspnea: Secondary | ICD-10-CM | POA: Insufficient documentation

## 2022-12-17 ENCOUNTER — Ambulatory Visit
Admission: RE | Admit: 2022-12-17 | Discharge: 2022-12-17 | Disposition: A | Discharge status: Home / Self Care | Payer: 59 | Source: Ambulatory Visit | Attending: Family Medicine | Admitting: Family Medicine

## 2022-12-17 DIAGNOSIS — Z1231 Encounter for screening mammogram for malignant neoplasm of breast: Secondary | ICD-10-CM | POA: Diagnosis not present

## 2022-12-17 NOTE — Progress Notes (Signed)
Normal CT cardiac report including vessel studies without calcium, normal size aorta, and normal lungs as well as upper abdomen. All very reassuring. Please let us know if you have any questions.  Thank you, Gwyneth Sprout, [address] [place], [place] [address] [phone] (phone) [phone] (fax) [place]

## 2022-12-21 NOTE — Progress Notes (Signed)
Hi Violet  Normal mammogram; repeat in 1 year.  Please let us know if you have any questions.  Thank you,  Tally Joe, FNP

## 2023-02-11 ENCOUNTER — Other Ambulatory Visit (HOSPITAL_COMMUNITY): Payer: Self-pay

## 2023-02-11 ENCOUNTER — Other Ambulatory Visit: Payer: Self-pay

## 2023-02-14 ENCOUNTER — Other Ambulatory Visit: Payer: Self-pay

## 2023-02-14 DIAGNOSIS — E890 Postprocedural hypothyroidism: Secondary | ICD-10-CM | POA: Diagnosis not present

## 2023-02-14 DIAGNOSIS — Z85850 Personal history of malignant neoplasm of thyroid: Secondary | ICD-10-CM | POA: Diagnosis not present

## 2023-02-14 MED ORDER — LEVOTHYROXINE SODIUM 175 MCG PO TABS
175.0000 ug | ORAL_TABLET | Freq: Every morning | ORAL | 3 refills | Status: DC
  Filled 2023-02-14: qty 30, 30d supply, fill #0
  Filled 2023-03-22 – 2023-04-01 (×2): qty 30, 30d supply, fill #1

## 2023-02-28 ENCOUNTER — Ambulatory Visit: Payer: 59 | Admitting: Family Medicine

## 2023-03-22 ENCOUNTER — Other Ambulatory Visit: Payer: Self-pay

## 2023-03-28 ENCOUNTER — Encounter: Payer: Self-pay | Admitting: Family Medicine

## 2023-03-29 ENCOUNTER — Other Ambulatory Visit: Payer: Self-pay

## 2023-03-29 ENCOUNTER — Telehealth (INDEPENDENT_AMBULATORY_CARE_PROVIDER_SITE_OTHER): Payer: 59 | Admitting: Family Medicine

## 2023-03-29 ENCOUNTER — Encounter: Payer: Self-pay | Admitting: Family Medicine

## 2023-03-29 VITALS — Ht 66.0 in | Wt 263.0 lb

## 2023-03-29 DIAGNOSIS — E559 Vitamin D deficiency, unspecified: Secondary | ICD-10-CM

## 2023-03-29 DIAGNOSIS — Z7689 Persons encountering health services in other specified circumstances: Secondary | ICD-10-CM | POA: Insufficient documentation

## 2023-03-29 DIAGNOSIS — E890 Postprocedural hypothyroidism: Secondary | ICD-10-CM

## 2023-03-29 DIAGNOSIS — Z6841 Body Mass Index (BMI) 40.0 and over, adult: Secondary | ICD-10-CM | POA: Diagnosis not present

## 2023-03-29 DIAGNOSIS — R7303 Prediabetes: Secondary | ICD-10-CM

## 2023-03-29 MED ORDER — PHENTERMINE HCL 37.5 MG PO CAPS
37.5000 mg | ORAL_CAPSULE | ORAL | 0 refills | Status: DC
  Filled 2023-03-29: qty 90, 90d supply, fill #0

## 2023-03-29 NOTE — Assessment & Plan Note (Signed)
7# weight loss noted; pt remains discouraged noting "I just want the scale to move" Discussed importance of healthy weight management Discussed diet and exercise

## 2023-03-29 NOTE — Progress Notes (Signed)
MyChart Video Visit    Virtual Visit via Video Note   This format is felt to be most appropriate for this patient at this time. Physical exam was limited by quality of the video and audio technology used for the visit.   Patient location: home Provider location: [facility] [address]  [address] [place], [place] [address]   I discussed the limitations of evaluation and management by telemedicine and the availability of in person appointments. The patient expressed understanding and agreed to proceed.  Patient: Brooke Gordon   DOB: 03-30-1981   42 y.o. Female  MRN: [id number] Visit Date: 03/29/2023  Today's healthcare provider: Jacky Kindle, FNP  Re Introduced to nurse practitioner role and practice setting.  All questions answered.  Discussed provider/patient relationship and expectations.   Chief Complaint  Patient presents with   Weight Check    Pt stated--have not lose weight . Last 263lb   Subjective    HPI HPI     Weight Check    Additional comments: Pt stated--have not lose weight . Last 263lb      Last edited by Shelly Bombard, CMA on 03/29/2023  9:19 AM.      Medications: Outpatient Medications Prior to Visit  Medication Sig   buPROPion (WELLBUTRIN XL) 150 MG 24 hr tablet Take 1 tablet (150 mg total) by mouth daily.   levothyroxine (SYNTHROID) 175 MCG tablet Take 1 tablet (175 mcg total) by mouth once daily. Take on an empty stomach with a glass of water at least 30-60 minutes before breakfast.   metFORMIN (GLUCOPHAGE-XR) 750 MG 24 hr tablet Take 1 tablet (750 mg total) by mouth daily with breakfast.   [DISCONTINUED] losartan-hydrochlorothiazide (HYZAAR) 50-12.5 MG tablet Take 1 tablet by mouth daily.   [DISCONTINUED] levothyroxine (SYNTHROID) 150 MCG tablet TAKE 1 TABLET BY MOUTH 6 DAYS PER WEEK   [DISCONTINUED] levothyroxine (SYNTHROID) 175 MCG tablet Take 1 tablet (175 mcg total) by mouth once daily Take on an empty stomach with a  glass of water at least 30-60 minutes before breakfast.   [DISCONTINUED] levothyroxine (SYNTHROID) 175 MCG tablet Take 1 tablet (175 mcg total) by mouth once daily Take on an empty stomach with a glass of water at least 30-60 minutes before breakfast.   [DISCONTINUED] bupivacaine (SENSORCAINE-MPF) 0.75 % injection    No facility-administered medications prior to visit.    Review of Systems  Last hemoglobin A1c Lab Results  Component Value Date   HGBA1C 5.7 (H) 11/29/2022   Last thyroid functions Lab Results  Component Value Date   TSH 18.900 (H) 11/29/2022   Last vitamin D Lab Results  Component Value Date   VD25OH 21.1 (L) 11/29/2022       Objective    Ht 5\' 6"  (1.676 m)   Wt 263 lb (119.3 kg)   Breastfeeding No   BMI 42.45 kg/m   BP Readings from Last 3 Encounters:  11/29/22 139/89  09/16/21 131/83  01/27/21 120/72   Wt Readings from Last 3 Encounters:  03/29/23 263 lb (119.3 kg)  11/29/22 270 lb 3.2 oz (122.6 kg)  09/16/21 261 lb 6.4 oz (118.6 kg)   Physical Exam Constitutional:      Appearance: She is obese.  Pulmonary:     Effort: Pulmonary effort is normal.  Neurological:     General: No focal deficit present.     Mental Status: She is alert.  Psychiatric:        Mood and Affect:  Mood normal.        Behavior: Behavior normal.        Thought Content: Thought content normal.        Judgment: Judgment normal.     Assessment & Plan     Problem List Items Addressed This Visit       Endocrine   Hypothyroidism    Chronic; "uncontrolled" per pt report from f/u with Solum MD at Endocrine in [facility] in 4/24 Pt has not repeat labs since dose change; encouraged to do so at this time/when she is able Pt denies symptoms of hyperthyroidism or over correction including rapid weight loss, nervousness or palpitations/sweating Continued at 175 mcg synthroid at this time; orders pending       Relevant Orders   TSH     Other   Encounter for weight management -  Primary    7# weight loss noted; pt remains discouraged noting "I just want the scale to move" Discussed importance of healthy weight management Discussed diet and exercise       Morbid obesity (HCC)    Chronic, 7# reported weight loss Wishes to continue on metformin 750, Wellbutrin 150 and add phentermine to assist Educated regarding common and possible side effects with use of Rx including by not limited to -tachycardia, palpitations -insomnia -anxiety/irritability -dry mouth Educated 3 month f/u needed to weight risk vs benefit; Rx is best used with diet/exercise Continue to recommend balanced, lower carb meals. Smaller meal size, adding snacks. Choosing water as drink of choice and increasing purposeful exercise.       Relevant Medications   phentermine 37.5 MG capsule   Prediabetes    Relatively new dx; no concerns with use of metformin 750 mg xr Repeat A1c Continue to work on "diabetic diet" increasing water intake and purposeful activity of 150-300 mins/week to assist weight mgmt       Relevant Orders   Hemoglobin A1c   Vitamin D deficiency    Chronic fatigue with both hypothyroidism, obesity and low findings of Vit D Was started on supplement Repeat labs at this time to gauge progress       Relevant Orders   Vitamin D (25 hydroxy)   Return in about 3 months (around 06/29/2023) for weight mgmt; a1c; vit d; etc .    I discussed the assessment and treatment plan with the patient. The patient was provided an opportunity to ask questions and all were answered. The patient agreed with the plan and demonstrated an understanding of the instructions.   The patient was advised to call back or seek an in-person evaluation if the symptoms worsen or if the condition fails to improve as anticipated.  I provided 10 minutes of face-to-face time during this encounter discussing weight mgmt and associated disease processes.   Leilani Merl, FNP, have reviewed all documentation  for this visit. The documentation on 03/29/23 for the exam, diagnosis, procedures, and orders are all accurate and complete.  Jacky Kindle, FNP [facility] [phone] (phone) [phone] (fax)  [facility]

## 2023-03-29 NOTE — Assessment & Plan Note (Signed)
Chronic; "uncontrolled" per pt report from f/u with Solum MD at Endocrine in [facility] in 4/24 Pt has not repeat labs since dose change; encouraged to do so at this time/when she is able Pt denies symptoms of hyperthyroidism or over correction including rapid weight loss, nervousness or palpitations/sweating Continued at 175 mcg synthroid at this time; orders pending

## 2023-03-29 NOTE — Assessment & Plan Note (Signed)
Chronic, 7# reported weight loss Wishes to continue on metformin 750, Wellbutrin 150 and add phentermine to assist Educated regarding common and possible side effects with use of Rx including by not limited to -tachycardia, palpitations -insomnia -anxiety/irritability -dry mouth Educated 3 month f/u needed to weight risk vs benefit; Rx is best used with diet/exercise Continue to recommend balanced, lower carb meals. Smaller meal size, adding snacks. Choosing water as drink of choice and increasing purposeful exercise.

## 2023-03-29 NOTE — Assessment & Plan Note (Signed)
Relatively new dx; no concerns with use of metformin 750 mg xr Repeat A1c Continue to work on "diabetic diet" increasing water intake and purposeful activity of 150-300 mins/week to assist weight mgmt

## 2023-03-29 NOTE — Assessment & Plan Note (Signed)
Chronic fatigue with both hypothyroidism, obesity and low findings of Vit D Was started on supplement Repeat labs at this time to gauge progress

## 2023-04-01 ENCOUNTER — Other Ambulatory Visit: Payer: Self-pay

## 2023-04-08 DIAGNOSIS — Z85850 Personal history of malignant neoplasm of thyroid: Secondary | ICD-10-CM | POA: Diagnosis not present

## 2023-04-08 DIAGNOSIS — E890 Postprocedural hypothyroidism: Secondary | ICD-10-CM | POA: Diagnosis not present

## 2023-04-13 ENCOUNTER — Other Ambulatory Visit: Payer: Self-pay

## 2023-04-13 DIAGNOSIS — E890 Postprocedural hypothyroidism: Secondary | ICD-10-CM | POA: Diagnosis not present

## 2023-04-13 DIAGNOSIS — Z85850 Personal history of malignant neoplasm of thyroid: Secondary | ICD-10-CM | POA: Diagnosis not present

## 2023-04-13 MED ORDER — LEVOTHYROXINE SODIUM 200 MCG PO TABS
200.0000 ug | ORAL_TABLET | Freq: Every day | ORAL | 1 refills | Status: DC
  Filled 2023-04-13: qty 90, 90d supply, fill #0
  Filled 2023-08-08: qty 90, 90d supply, fill #1

## 2023-04-20 ENCOUNTER — Other Ambulatory Visit: Payer: Self-pay

## 2023-04-20 ENCOUNTER — Telehealth: Payer: 59 | Admitting: Nurse Practitioner

## 2023-04-20 DIAGNOSIS — J0140 Acute pansinusitis, unspecified: Secondary | ICD-10-CM | POA: Diagnosis not present

## 2023-04-20 DIAGNOSIS — R051 Acute cough: Secondary | ICD-10-CM

## 2023-04-20 MED ORDER — FLUTICASONE PROPIONATE 50 MCG/ACT NA SUSP
2.0000 | Freq: Every day | NASAL | 6 refills | Status: DC
Start: 2023-04-20 — End: 2023-09-09
  Filled 2023-04-20: qty 16, 30d supply, fill #0

## 2023-04-20 MED ORDER — DOXYCYCLINE HYCLATE 100 MG PO TABS
100.0000 mg | ORAL_TABLET | Freq: Two times a day (BID) | ORAL | 0 refills | Status: AC
Start: 2023-04-20 — End: 2023-04-30
  Filled 2023-04-20: qty 20, 10d supply, fill #0

## 2023-04-20 MED ORDER — BENZONATATE 100 MG PO CAPS
100.0000 mg | ORAL_CAPSULE | Freq: Three times a day (TID) | ORAL | 0 refills | Status: DC | PRN
Start: 2023-04-20 — End: 2023-09-09
  Filled 2023-04-20: qty 30, 10d supply, fill #0

## 2023-04-20 NOTE — Progress Notes (Signed)
Virtual Visit Consent   Brooke Gordon, you are scheduled for a virtual visit with a [place] provider today. Just as with appointments in the office, your consent must be obtained to participate. Your consent will be active for this visit and any virtual visit you may have with one of our providers in the next 365 days. If you have a MyChart account, a copy of this consent can be sent to you electronically.  As this is a virtual visit, video technology does not allow for your provider to perform a traditional examination. This may limit your provider's ability to fully assess your condition. If your provider identifies any concerns that need to be evaluated in person or the need to arrange testing (such as labs, EKG, etc.), we will make arrangements to do so. Although advances in technology are sophisticated, we cannot ensure that it will always work on either your end or our end. If the connection with a video visit is poor, the visit may have to be switched to a telephone visit. With either a video or telephone visit, we are not always able to ensure that we have a secure connection.  By engaging in this virtual visit, you consent to the provision of healthcare and authorize for your insurance to be billed (if applicable) for the services provided during this visit. Depending on your insurance coverage, you may receive a charge related to this service.  I need to obtain your verbal consent now. Are you willing to proceed with your visit today? Brooke Gordon has provided verbal consent on 04/20/2023 for a virtual visit (video or telephone). Viviano Simas, FNP  Date: 04/20/2023 3:26 PM  Virtual Visit via Video Note   I, Viviano Simas, connected with  Brooke Gordon  ([id number], 1981/01/12) on 04/20/23 at  3:30 PM EDT by a video-enabled telemedicine application and verified that I am speaking with the correct person using two identifiers.  Location: Patient: Virtual Visit Location Patient:  Home Provider: Virtual Visit Location Provider: Home Office   I discussed the limitations of evaluation and management by telemedicine and the availability of in person appointments. The patient expressed understanding and agreed to proceed.    History of Present Illness: Brooke Gordon is a 42 y.o. who identifies as a female who was assigned female at birth, and is being seen today for a sinus infections and cough  She has been suffering from a cough and congestion for the past week +  Cough sounds productive though she is not coughing much up   She has been using over the counter and home remedies without relief  Mucinex,Theraflu Delsym   Symptoms started with a loss of voice, progressed to a cough and then more sinus congestion  She did initially have body aches  She took a COVID test at the start of the illness and that was negative   Denies a history of asthma or need for inhalers in the past   Problems:  Patient Active Problem List   Diagnosis Date Noted   Encounter for weight management 03/29/2023   Morbid obesity (HCC) 03/29/2023   Prediabetes 11/29/2022   DOE (dyspnea on exertion) 11/29/2022   Elevated alkaline phosphatase level 09/16/2021   Chronic fatigue 09/16/2021   Encounter for lipid screening for cardiovascular disease 09/16/2021   Annual physical exam 09/16/2021   Encounter for hepatitis C screening test for low risk patient 09/16/2021   Encounter for screening colonoscopy    Polyp of colon  Abnormal uterine bleeding (AUB)    Abnormal vaginal Pap smear 07/12/2016   Anemia 07/12/2016   Hypothyroidism 07/12/2016   Non-atypical endometrial cells on cervical Pap smear 08/06/2014   Vitamin D deficiency 08/06/2014   Hepatic steatosis 05/09/2014    Allergies:  Allergies  Allergen Reactions   Codeine Other (See Comments)    Childhood reaction  Reaction:  Unknown    Medications:  Current Outpatient Medications:    buPROPion (WELLBUTRIN XL) 150 MG 24 hr  tablet, Take 1 tablet (150 mg total) by mouth daily., Disp: 30 tablet, Rfl: 11   levothyroxine (SYNTHROID) 175 MCG tablet, Take 1 tablet (175 mcg total) by mouth once daily. Take on an empty stomach with a glass of water at least 30-60 minutes before breakfast., Disp: 90 tablet, Rfl: 1   levothyroxine (SYNTHROID) 200 MCG tablet, Take 1 tablet (200 mcg total) by mouth daily. Take on an empty stomach with a glass of water at least 30-60 minutes before breakfast., Disp: 90 tablet, Rfl: 1   metFORMIN (GLUCOPHAGE-XR) 750 MG 24 hr tablet, Take 1 tablet (750 mg total) by mouth daily with breakfast., Disp: 30 tablet, Rfl: 11   phentermine 37.5 MG capsule, Take 1 capsule (37.5 mg total) by mouth every morning., Disp: 90 capsule, Rfl: 0  Observations/Objective: Patient is well-developed, well-nourished in no acute distress.  Resting comfortably  at home.  Head is normocephalic, atraumatic.  No labored breathing.  Speech is clear and coherent with logical content.  Patient is alert and oriented at baseline.    Assessment and Plan: 1. Acute non-recurrent pansinusitis  - doxycycline (VIBRA-TABS) 100 MG tablet; Take 1 tablet (100 mg total) by mouth 2 (two) times daily for 10 days.  Dispense: 20 tablet; Refill: 0 - fluticasone (FLONASE) 50 MCG/ACT nasal spray; Place 2 sprays into both nostrils daily.  Dispense: 16 g; Refill: 6  2. Acute cough  - benzonatate (TESSALON) 100 MG capsule; Take 1 capsule (100 mg total) by mouth 3 (three) times daily as needed.  Dispense: 30 capsule; Refill: 0     Follow Up Instructions: I discussed the assessment and treatment plan with the patient. The patient was provided an opportunity to ask questions and all were answered. The patient agreed with the plan and demonstrated an understanding of the instructions.  A copy of instructions were sent to the patient via MyChart unless otherwise noted below.    The patient was advised to call back or seek an in-person evaluation  if the symptoms worsen or if the condition fails to improve as anticipated.  Time:  I spent 15 minutes with the patient via telehealth technology discussing the above problems/concerns.    Viviano Simas, FNP

## 2023-07-13 ENCOUNTER — Other Ambulatory Visit: Payer: Self-pay | Admitting: Family Medicine

## 2023-07-14 ENCOUNTER — Other Ambulatory Visit: Payer: Self-pay

## 2023-07-14 ENCOUNTER — Other Ambulatory Visit: Payer: Self-pay | Admitting: Family Medicine

## 2023-07-14 NOTE — Telephone Encounter (Signed)
Left vm for pt to call back to make an appt.  Okay for [facility] to schedule when pt calls back.

## 2023-07-14 NOTE — Telephone Encounter (Signed)
Requested medication (s) are due for refill today: yes  Requested medication (s) are on the active medication list: yes  Last refill:  03/29/23  Future visit scheduled: no  Notes to clinic:  Unable to refill per protocol, cannot delegate.      Requested Prescriptions  Pending Prescriptions Disp Refills   phentermine 37.5 MG capsule 90 capsule 0    Sig: Take 1 capsule (37.5 mg total) by mouth every morning.     Not Delegated - Neurology: Anticonvulsants - Controlled - phentermine hydrochloride Failed - 07/14/2023  8:20 AM      Failed - This refill cannot be delegated      Failed - Weight completed in the last 3 months    Wt Readings from Last 1 Encounters:  03/29/23 263 lb (119.3 kg)         Passed - eGFR in normal range and within 360 days    GFR calc Af Amer  Date Value Ref Range Status  09/08/2020 124 >59 mL/min/1.73 Final    Comment:    **In accordance with recommendations from the NKF-ASN Task force,**   [facility] is in the process of updating its eGFR calculation to the   2021 CKD-EPI creatinine equation that estimates kidney function   without a race variable.    GFR calc non Af Amer  Date Value Ref Range Status  09/08/2020 108 >59 mL/min/1.73 Final   eGFR  Date Value Ref Range Status  11/29/2022 110 >59 mL/min/1.73 Final         Passed - Cr in normal range and within 360 days    Creatinine, Ser  Date Value Ref Range Status  11/29/2022 0.69 0.57 - 1.00 mg/dL Final         Passed - Last BP in normal range    BP Readings from Last 1 Encounters:  11/29/22 139/89         Passed - Valid encounter within last 6 months    Recent Outpatient Visits           3 months ago Encounter for weight management   [facility] Jacky Kindle, FNP   7 months ago Annual physical exam   [facility] Jacky Kindle, FNP   1 year ago Annual physical exam   [facility] Merita Norton T, FNP   2  years ago Reactive airway disease without complication, unspecified asthma severity, unspecified whether persistent   [facility] [place], Ricki Rodriguez M, [place]   2 years ago Pneumonia of lower lobe due to infectious organism, unspecified laterality   [facility], [place], [place]

## 2023-07-15 ENCOUNTER — Other Ambulatory Visit: Payer: Self-pay | Admitting: Family Medicine

## 2023-07-15 ENCOUNTER — Encounter: Payer: Self-pay | Admitting: Family Medicine

## 2023-07-15 ENCOUNTER — Other Ambulatory Visit: Payer: Self-pay

## 2023-07-15 MED ORDER — PHENTERMINE HCL 37.5 MG PO CAPS
37.5000 mg | ORAL_CAPSULE | ORAL | 0 refills | Status: DC
  Filled 2023-07-15: qty 90, 90d supply, fill #0

## 2023-07-15 NOTE — Progress Notes (Signed)
Pt called to note they can provide their blood pressure, heart rate/pulse and weight to ensure safe use of medication.

## 2023-07-18 ENCOUNTER — Ambulatory Visit (INDEPENDENT_AMBULATORY_CARE_PROVIDER_SITE_OTHER): Payer: 59 | Admitting: Family Medicine

## 2023-07-18 DIAGNOSIS — Z91199 Patient's noncompliance with other medical treatment and regimen due to unspecified reason: Secondary | ICD-10-CM

## 2023-07-18 NOTE — Progress Notes (Signed)
Patient was not seen for appt d/t no call, no show, or late arrival >10 mins past appt time.   Elise T Payne, FNP  [facility] [address] [place], [place] [address] [phone] (phone) [phone] (fax) [facility]  

## 2023-08-08 ENCOUNTER — Other Ambulatory Visit: Payer: Self-pay

## 2023-09-08 ENCOUNTER — Ambulatory Visit
Admission: EM | Admit: 2023-09-08 | Discharge: 2023-09-08 | Disposition: A | Discharge status: Home / Self Care | Payer: 59 | Attending: Emergency Medicine | Admitting: Emergency Medicine

## 2023-09-08 ENCOUNTER — Telehealth: Payer: 59 | Admitting: Emergency Medicine

## 2023-09-08 ENCOUNTER — Other Ambulatory Visit: Payer: Self-pay

## 2023-09-08 ENCOUNTER — Ambulatory Visit: Payer: 59

## 2023-09-08 DIAGNOSIS — R0602 Shortness of breath: Secondary | ICD-10-CM | POA: Diagnosis not present

## 2023-09-08 DIAGNOSIS — R059 Cough, unspecified: Secondary | ICD-10-CM | POA: Diagnosis not present

## 2023-09-08 DIAGNOSIS — R051 Acute cough: Secondary | ICD-10-CM | POA: Diagnosis not present

## 2023-09-08 MED ORDER — ALBUTEROL SULFATE HFA 108 (90 BASE) MCG/ACT IN AERS
1.0000 | INHALATION_SPRAY | Freq: Four times a day (QID) | RESPIRATORY_TRACT | 0 refills | Status: DC | PRN
  Filled 2023-09-08: qty 6.7, 25d supply, fill #0

## 2023-09-08 MED ORDER — AMOXICILLIN-POT CLAVULANATE 875-125 MG PO TABS
1.0000 | ORAL_TABLET | Freq: Two times a day (BID) | ORAL | 0 refills | Status: DC
  Filled 2023-09-08: qty 14, 7d supply, fill #0

## 2023-09-08 MED ORDER — AZITHROMYCIN 250 MG PO TABS
ORAL_TABLET | ORAL | 0 refills | Status: AC
  Filled 2023-09-08: qty 6, 5d supply, fill #0

## 2023-09-08 NOTE — ED Triage Notes (Signed)
Patient presents to UC for persistent mild cough, SOB, fatigue, left mid back pain, getting winded, pain with deep breathing x 5 days. Taking ibuprofen for pain.

## 2023-09-08 NOTE — Progress Notes (Signed)
Redirected to in-person visit based on lung pain and SOB.  Patient responded via myChart message stating that she'd go be seen in person, but wasn't sure if she needed to still log in for the video visit.  No charge.

## 2023-09-08 NOTE — ED Provider Notes (Signed)
Brooke Gordon    CSN: [id number] Arrival date & time: 09/08/23  1321      History   Chief Complaint Chief Complaint  Patient presents with   Cough   Shortness of Breath   Fatigue    HPI Brooke Gordon is a 42 y.o. female.  Patient presents with cough x 2 weeks.  She reports shortness of breath, fatigue, mid back pain x 5 days which is getting worse.  No OTC medications taken today but previously treating discomfort with ibuprofen.  She denies fever, sore throat, chest pain, or other symptoms.   Patient had a telemedicine visit today for shortness of breath and was instructed to be seen in person.  The history is provided by the patient and medical records.    Past Medical History:  Diagnosis Date   Anemia    pregnancy only   Complication of anesthesia    Hypothyroidism    h/o thyroidectomy   Macrosomia    Malignant neoplasm of thyroid gland (HCC)    Papillary carcinoma of thyroid (HCC) 2016   Pneumonia    08/2020   PONV (postoperative nausea and vomiting)     Patient Active Problem List   Diagnosis Date Noted   Encounter for weight management 03/29/2023   Prediabetes 11/29/2022   DOE (dyspnea on exertion) 11/29/2022   Elevated alkaline phosphatase level 09/16/2021   Chronic fatigue 09/16/2021   Encounter for lipid screening for cardiovascular disease 09/16/2021   Annual physical exam 09/16/2021   Encounter for hepatitis C screening test for low risk patient 09/16/2021   Encounter for screening colonoscopy    Polyp of colon    Abnormal uterine bleeding (AUB)    Abnormal vaginal Pap smear 07/12/2016   Anemia 07/12/2016   Hypothyroidism 07/12/2016   Non-atypical endometrial cells on cervical Pap smear 08/06/2014   Vitamin D deficiency 08/06/2014   Hepatic steatosis 05/09/2014    Past Surgical History:  Procedure Laterality Date   BILATERAL SALPINGECTOMY     COLONOSCOPY WITH PROPOFOL N/A 01/15/2021   Procedure: COLONOSCOPY WITH PROPOFOL;  Surgeon:  Pasty Spillers, MD;  Location: [facility] ENDOSCOPY;  Service: Gastroenterology;  Laterality: N/A;   COLPOSCOPY  07/2012   neg   DILATATION AND CURETTAGE/HYSTEROSCOPY WITH MINERVA N/A 12/18/2020   Procedure: DILATATION AND CURETTAGE/HYSTEROSCOPY WITH MINERVA;  Surgeon: Vena Austria, MD;  Location: [facility] ORS;  Service: Gynecology;  Laterality: N/A;   LAPAROSCOPIC TUBAL LIGATION N/A 02/03/2017   Procedure: LAPAROSCOPIC TUBAL LIGATION via SALPINGECTOMY;  Surgeon: Vena Austria, MD;  Location: [facility] ORS;  Service: Gynecology;  Laterality: N/A;   THYROIDECTOMY N/A 06/25/2015   Procedure: Total thyroidectomy with laryngeal nerve monitoring ;  Surgeon: Geanie Logan, MD;  Location: [facility] ORS;  Service: ENT;  Laterality: N/A;   TUBAL LIGATION  02/03/2017   WISDOM TOOTH EXTRACTION      OB History     Gravida  6   Para  6   Term  6   Preterm  0   AB  0   Living  6      SAB  0   IAB  0   Ectopic  0   Multiple  0   Live Births  6            Home Medications    Prior to Admission medications   Medication Sig Start Date End Date Taking? Authorizing Provider  albuterol (VENTOLIN HFA) 108 (90 Base) MCG/ACT inhaler Inhale 1-2 puffs into the lungs every 6 (  six) hours as needed. 09/08/23  Yes Mickie Bail, NP  amoxicillin-clavulanate (AUGMENTIN) 875-125 MG tablet Take 1 tablet by mouth every 12 (twelve) hours. 09/08/23  Yes Mickie Bail, NP  azithromycin (ZITHROMAX) 250 MG tablet Take 2 tablets (500 mg total) by mouth daily for 1 day, THEN 1 tablet (250 mg total) daily for 4 days. Take first 2 tablets together, then 1 every day until finished.. 09/08/23 09/13/23 Yes Mickie Bail, NP  benzonatate (TESSALON) 100 MG capsule Take 1 capsule (100 mg total) by mouth 3 (three) times daily as needed. 04/20/23   Viviano Simas, FNP  buPROPion (WELLBUTRIN XL) 150 MG 24 hr tablet Take 1 tablet (150 mg total) by mouth daily. 11/29/22   Jacky Kindle, FNP  fluticasone (FLONASE) 50 MCG/ACT nasal  spray Place 2 sprays into both nostrils daily. 04/20/23   Viviano Simas, FNP  levothyroxine (SYNTHROID) 175 MCG tablet Take 1 tablet (175 mcg total) by mouth once daily. Take on an empty stomach with a glass of water at least 30-60 minutes before breakfast. 11/10/21     levothyroxine (SYNTHROID) 200 MCG tablet Take 1 tablet (200 mcg total) by mouth daily. Take on an empty stomach with a glass of water at least 30-60 minutes before breakfast. 04/13/23     metFORMIN (GLUCOPHAGE-XR) 750 MG 24 hr tablet Take 1 tablet (750 mg total) by mouth daily with breakfast. 11/29/22   Jacky Kindle, FNP  phentermine 37.5 MG capsule Take 1 capsule (37.5 mg total) by mouth every morning. 07/15/23   Jacky Kindle, FNP    Family History Family History  Problem Relation Age of Onset   Cancer Other     Social History Social History   Tobacco Use   Smoking status: Former    Current packs/day: 0.00    Average packs/day: 0.3 packs/day for 10.0 years (2.5 ttl pk-yrs)    Types: Cigarettes    Start date: 06/23/1998    Quit date: 06/23/2008    Years since quitting: 15.2   Smokeless tobacco: Never  Vaping Use   Vaping status: Never Used  Substance Use Topics   Alcohol use: Yes    Comment: very rare   Drug use: No     Allergies   Codeine   Review of Systems Review of Systems  Constitutional:  Positive for fatigue. Negative for chills and fever.  HENT:  Negative for ear pain and sore throat.   Respiratory:  Positive for cough and shortness of breath.   Cardiovascular:  Negative for chest pain and palpitations.  Musculoskeletal:  Positive for back pain. Negative for gait problem.  Skin:  Negative for color change, rash and wound.     Physical Exam Triage Vital Signs ED Triage Vitals  Encounter Vitals Group     BP 09/08/23 1429 118/79     Systolic BP Percentile --      Diastolic BP Percentile --      Pulse Rate 09/08/23 1429 76     Resp 09/08/23 1429 16     Temp 09/08/23 1429 (!) 97 F (36.1 C)      Temp Source 09/08/23 1429 Temporal     SpO2 09/08/23 1429 98 %     Weight --      Height --      Head Circumference --      Peak Flow --      Pain Score 09/08/23 1428 5     Pain Loc --  Pain Education --      Exclude from Growth Chart --    No data found.  Updated Vital Signs BP 118/79 (BP Location: Left Arm)   Pulse 76   Temp (!) 97 F (36.1 C) (Temporal)   Resp 16   LMP 08/25/2023 (Approximate)   SpO2 98%   Visual Acuity Right Eye Distance:   Left Eye Distance:   Bilateral Distance:    Right Eye Near:   Left Eye Near:    Bilateral Near:     Physical Exam Constitutional:      General: She is not in acute distress. HENT:     Right Ear: Tympanic membrane normal.     Left Ear: Tympanic membrane normal.     Nose: Nose normal.     Mouth/Throat:     Mouth: Mucous membranes are moist.     Pharynx: Oropharynx is clear.  Cardiovascular:     Rate and Rhythm: Normal rate and regular rhythm.     Heart sounds: Normal heart sounds.  Pulmonary:     Effort: Pulmonary effort is normal. No respiratory distress.     Breath sounds: Normal breath sounds.  Musculoskeletal:        General: No swelling, tenderness or deformity. Normal range of motion.  Skin:    General: Skin is warm and dry.     Findings: No bruising, erythema, lesion or rash.  Neurological:     Mental Status: She is alert.      UC Treatments / Results  Labs (all labs ordered are listed, but only abnormal results are displayed) Labs Reviewed - No data to display  EKG   Radiology DG Chest 2 View  Result Date: 09/08/2023 CLINICAL DATA:  Cough Shortness of breath EXAM: CHEST - 2 VIEW COMPARISON:  None available. FINDINGS: Cardiomediastinal silhouette and pulmonary vasculature are within normal limits. Lungs are clear. IMPRESSION: No acute cardiopulmonary process. Electronically Signed   By: Acquanetta Belling M.D.   On: 09/08/2023 16:22    Procedures Procedures (including critical care  time)  Medications Ordered in UC Medications - No data to display  Initial Impression / Assessment and Plan / UC Course  I have reviewed the triage vital signs and the nursing notes.  Pertinent labs & imaging results that were available during my care of the patient were reviewed by me and considered in my medical decision making (see chart for details).   Shortness of breath, cough, acute bronchitis.  Afebrile and vital signs are stable.  O2 sat 98% on room air.  CXR negative.  Treating with albuterol inhaler, Zithromax, Augmentin.  Instructed patient to follow-up with her PCP tomorrow.  ED precautions given.  Education provided on shortness of breath and cough.  She agrees to plan of care.   Final Clinical Impressions(s) / UC Diagnoses   Final diagnoses:  Shortness of breath  Acute cough     Discharge Instructions      Take the Augmentin and Zithromax as directed.  Use the albuterol inhaler as directed.    Follow up with your primary care provider tomorrow.  Go to the emergency department if you have worsening symptoms.        ED Prescriptions     Medication Sig Dispense Auth. Provider   albuterol (VENTOLIN HFA) 108 (90 Base) MCG/ACT inhaler Inhale 1-2 puffs into the lungs every 6 (six) hours as needed. 6.7 g Mickie Bail, NP   azithromycin (ZITHROMAX) 250 MG tablet Take 2 tablets (500 mg  total) by mouth daily for 1 day, THEN 1 tablet (250 mg total) daily for 4 days. Take first 2 tablets together, then 1 every day until finished.. 6 tablet Mickie Bail, NP   amoxicillin-clavulanate (AUGMENTIN) 875-125 MG tablet Take 1 tablet by mouth every 12 (twelve) hours. 14 tablet Mickie Bail, NP      PDMP not reviewed this encounter.   Mickie Bail, NP 09/08/23 [phone]

## 2023-09-08 NOTE — Discharge Instructions (Addendum)
Take the Augmentin and Zithromax as directed.  Use the albuterol inhaler as directed.    Follow up with your primary care provider tomorrow.  Go to the emergency department if you have worsening symptoms.

## 2023-09-09 ENCOUNTER — Other Ambulatory Visit: Payer: Self-pay

## 2023-09-09 ENCOUNTER — Ambulatory Visit (INDEPENDENT_AMBULATORY_CARE_PROVIDER_SITE_OTHER): Payer: 59 | Admitting: Family Medicine

## 2023-09-09 ENCOUNTER — Encounter: Payer: Self-pay | Admitting: Family Medicine

## 2023-09-09 VITALS — BP 126/77 | HR 80 | Temp 98.0°F | Ht 66.0 in | Wt 238.0 lb

## 2023-09-09 DIAGNOSIS — I401 Isolated myocarditis: Secondary | ICD-10-CM | POA: Insufficient documentation

## 2023-09-09 MED ORDER — COLCHICINE 0.6 MG PO TABS
0.6000 mg | ORAL_TABLET | Freq: Two times a day (BID) | ORAL | 0 refills | Status: DC
  Filled 2023-09-09: qty 10, 5d supply, fill #0

## 2023-09-09 NOTE — Progress Notes (Unsigned)
Established patient visit   Patient: Brooke Gordon   DOB: [birth year]   42 y.o. Female  MRN: [id number] Visit Date: 09/09/2023  Today's healthcare provider: Jacky Kindle, FNP  Re Introduced to nurse practitioner role and practice setting.  All questions answered.  Discussed provider/patient relationship and expectations.  Chief Complaint  Patient presents with   floow-up urgent care    Pt stated--fatigue, coughing w/ white mucus, breathing problem, upper back pain--   Subjective    HPI HPI     floow-up urgent care    Additional comments: Pt stated--fatigue, coughing w/ white mucus, breathing problem, upper back pain--      Last edited by Shelly Bombard, CMA on 09/09/2023  1:37 PM.      Medications: Outpatient Medications Prior to Visit  Medication Sig   albuterol (VENTOLIN HFA) 108 (90 Base) MCG/ACT inhaler Inhale 1-2 puffs into the lungs every 6 (six) hours as needed.   amoxicillin-clavulanate (AUGMENTIN) 875-125 MG tablet Take 1 tablet by mouth every 12 (twelve) hours.   azithromycin (ZITHROMAX) 250 MG tablet Take 2 tablets (500 mg total) by mouth daily for 1 day, THEN 1 tablet (250 mg total) daily for 4 days. Take first 2 tablets together, then 1 every day until finished..   levothyroxine (SYNTHROID) 200 MCG tablet Take 1 tablet (200 mcg total) by mouth daily. Take on an empty stomach with a glass of water at least 30-60 minutes before breakfast.   phentermine 37.5 MG capsule Take 1 capsule (37.5 mg total) by mouth every morning.   [DISCONTINUED] benzonatate (TESSALON) 100 MG capsule Take 1 capsule (100 mg total) by mouth 3 (three) times daily as needed.   [DISCONTINUED] buPROPion (WELLBUTRIN XL) 150 MG 24 hr tablet Take 1 tablet (150 mg total) by mouth daily.   [DISCONTINUED] fluticasone (FLONASE) 50 MCG/ACT nasal spray Place 2 sprays into both nostrils daily.   [DISCONTINUED] levothyroxine (SYNTHROID) 175 MCG tablet Take 1 tablet (175 mcg total) by mouth once daily.  Take on an empty stomach with a glass of water at least 30-60 minutes before breakfast.   [DISCONTINUED] metFORMIN (GLUCOPHAGE-XR) 750 MG 24 hr tablet Take 1 tablet (750 mg total) by mouth daily with breakfast.   No facility-administered medications prior to visit.     Objective    BP 126/77   Pulse 80   Temp 98 F (36.7 C)   Ht 5\' 6"  (1.676 m)   Wt 238 lb (108 kg)   LMP 08/25/2023 (Approximate)   BMI 38.41 kg/m   BP Readings from Last 3 Encounters:  09/09/23 126/77  09/08/23 118/79  11/29/22 139/89   Wt Readings from Last 3 Encounters:  09/09/23 238 lb (108 kg)  03/29/23 263 lb (119.3 kg)  11/29/22 270 lb 3.2 oz (122.6 kg)   SpO2 Readings from Last 3 Encounters:  09/08/23 98%  11/29/22 98%  09/16/21 98%   Physical Exam Vitals and nursing note reviewed.  Constitutional:      General: She is not in acute distress.    Appearance: Normal appearance. She is obese. She is not ill-appearing, toxic-appearing or diaphoretic.  HENT:     Head: Normocephalic and atraumatic.  Cardiovascular:     Rate and Rhythm: Normal rate and regular rhythm.     Pulses: Normal pulses.     Heart sounds: No murmur heard.    Friction rub present. No gallop.  Pulmonary:     Effort: Pulmonary effort is normal. No respiratory distress.  Breath sounds: Normal breath sounds. No stridor. No wheezing, rhonchi or rales.  Chest:     Chest wall: No tenderness.  Abdominal:     Tenderness: There is no right CVA tenderness or left CVA tenderness.  Musculoskeletal:        General: No swelling, tenderness, deformity or signs of injury. Normal range of motion.     Right lower leg: No edema.     Left lower leg: No edema.  Skin:    General: Skin is warm and dry.     Capillary Refill: Capillary refill takes less than 2 seconds.     Coloration: Skin is not jaundiced or pale.     Findings: No bruising, erythema, lesion or rash.  Neurological:     General: No focal deficit present.     Mental Status:  She is alert and oriented to person, place, and time. Mental status is at baseline.     Cranial Nerves: No cranial nerve deficit.     Sensory: No sensory deficit.     Motor: No weakness.     Coordination: Coordination normal.  Psychiatric:        Mood and Affect: Mood normal.        Behavior: Behavior normal.        Thought Content: Thought content normal.        Judgment: Judgment normal.     No results found for any visits on 09/09/23.  Assessment & Plan     Problem List Items Addressed This Visit       Cardiovascular and Mediastinum   Acute idiopathic myocarditis - Primary   Return if symptoms worsen or fail to improve.     Leilani Merl, FNP, have reviewed all documentation for this visit. The documentation on 09/09/23 for the exam, diagnosis, procedures, and orders are all accurate and complete.  Jacky Kindle, FNP  [facility] [phone] (phone) [phone] (fax)  [facility]

## 2023-09-09 NOTE — Assessment & Plan Note (Addendum)
Acute, reports pain L side laying and when trying to breath deeply Currently treated for CAP CXR completed Encouraged to trial colchicine to assist  Encouraged to continue ABX for CAP Emergency protocol verified; pt voiced understanding

## 2023-09-20 ENCOUNTER — Other Ambulatory Visit: Payer: Self-pay

## 2023-12-19 ENCOUNTER — Other Ambulatory Visit: Payer: Self-pay

## 2023-12-20 ENCOUNTER — Other Ambulatory Visit: Payer: Self-pay | Admitting: Family Medicine

## 2023-12-20 ENCOUNTER — Other Ambulatory Visit: Payer: Self-pay

## 2023-12-20 MED FILL — Phentermine HCl Cap 37.5 MG: ORAL | 30 days supply | Qty: 30 | Fill #0 | Status: AC

## 2023-12-22 ENCOUNTER — Other Ambulatory Visit: Payer: Self-pay

## 2023-12-23 ENCOUNTER — Other Ambulatory Visit: Payer: Self-pay

## 2023-12-23 MED ORDER — LEVOTHYROXINE SODIUM 200 MCG PO TABS
200.0000 ug | ORAL_TABLET | Freq: Every day | ORAL | 0 refills | Status: DC
  Filled 2023-12-23: qty 30, 30d supply, fill #0

## 2024-02-10 ENCOUNTER — Other Ambulatory Visit: Payer: Self-pay

## 2024-02-13 DIAGNOSIS — E890 Postprocedural hypothyroidism: Secondary | ICD-10-CM | POA: Diagnosis not present

## 2024-02-14 ENCOUNTER — Ambulatory Visit: Admitting: Family Medicine

## 2024-02-14 ENCOUNTER — Other Ambulatory Visit: Payer: Self-pay

## 2024-02-14 VITALS — BP 126/62 | HR 73 | Ht 66.0 in | Wt 243.0 lb

## 2024-02-14 DIAGNOSIS — E559 Vitamin D deficiency, unspecified: Secondary | ICD-10-CM | POA: Diagnosis not present

## 2024-02-14 DIAGNOSIS — Z1231 Encounter for screening mammogram for malignant neoplasm of breast: Secondary | ICD-10-CM | POA: Diagnosis not present

## 2024-02-14 DIAGNOSIS — Z130 Encounter for screening for diseases of the blood and blood-forming organs and certain disorders involving the immune mechanism: Secondary | ICD-10-CM | POA: Diagnosis not present

## 2024-02-14 DIAGNOSIS — Z6839 Body mass index (BMI) 39.0-39.9, adult: Secondary | ICD-10-CM

## 2024-02-14 DIAGNOSIS — E890 Postprocedural hypothyroidism: Secondary | ICD-10-CM

## 2024-02-14 DIAGNOSIS — D538 Other specified nutritional anemias: Secondary | ICD-10-CM | POA: Diagnosis not present

## 2024-02-14 DIAGNOSIS — Z13220 Encounter for screening for lipoid disorders: Secondary | ICD-10-CM

## 2024-02-14 MED ORDER — TIRZEPATIDE-WEIGHT MANAGEMENT 2.5 MG/0.5ML ~~LOC~~ SOLN
2.5000 mg | SUBCUTANEOUS | 1 refills | Status: DC
  Filled 2024-02-14: qty 3, 42d supply, fill #0

## 2024-02-14 NOTE — Progress Notes (Signed)
 Established patient visit   Patient: Brooke Gordon   DOB: 1981-03-29   43 y.o. Female  MRN: [id number] Visit Date: 02/14/2024  Today's healthcare provider: Mimi Alt, MD   Chief Complaint  Patient presents with   Establish Care    No concerns    Subjective     HPI     Establish Care    Additional comments: No concerns       Last edited by Bart Lieu, CMA on 02/14/2024  1:36 PM.       Discussed the use of AI scribe software for clinical note transcription with the patient, who gave verbal consent to proceed.  History of Present Illness Brooke Gordon is a 43 year old female who presents to transfer care from a previous provider.  She has a history of thyroid  cancer, diagnosed in 2016, treated with thyroidectomy and radioactive iodine . She follows up with an endocrinologist for post-thyroidectomy management and had recent labs done. She is on Synthroid  200 mcg daily for thyroid  hormone replacement but struggles with consistent medication adherence.  She has been on phentermine  37.5 mcg for weight management, initially losing 33 pounds but plateaued, leading her to wean off the medication about a month ago. She has since started gaining weight again. She has tried other medications for weight loss, including metformin  and Wellbutrin , without success. She engages in lifestyle modifications such as walking four to five times a week and maintaining a low-carb diet. Her BMI is 39.22, and she wants to lose weight.  She has a history of precancerous polyps removed during a colonoscopy and was advised to have more frequent colonoscopies due to her past radioactive iodine  treatment. She is unsure about the frequency of these screenings.  She reports regular menstrual cycles and has had a mammogram and Pap smear in the past, with the next Pap smear due next year. She is a Engineer, civil (consulting) and describes herself as having difficulty remembering to take her medications.  She has six children, five of whom live at home, impacting her ability to engage in more intensive exercise routines.     Past Medical History:  Diagnosis Date   Anemia    pregnancy only   Complication of anesthesia    Hypothyroidism    h/o thyroidectomy   Macrosomia    Malignant neoplasm of thyroid  gland (HCC)    Papillary carcinoma of thyroid  (HCC) 2016   Pneumonia    08/2020   PONV (postoperative nausea and vomiting)     Medications: Outpatient Medications Prior to Visit  Medication Sig   levothyroxine  (SYNTHROID ) 200 MCG tablet Take 1 tablet (200 mcg total) by mouth daily. Take on an empty stomach with a glass of water at least 30-60 minutes before breakfast.   phentermine  37.5 MG capsule Take 1 capsule (37.5 mg total) by mouth every morning.   albuterol  (VENTOLIN  HFA) 108 (90 Base) MCG/ACT inhaler Inhale 1-2 puffs into the lungs every 6 (six) hours as needed.   amoxicillin -clavulanate (AUGMENTIN ) 875-125 MG tablet Take 1 tablet by mouth every 12 (twelve) hours.   colchicine  0.6 MG tablet Take 1 tablet (0.6 mg total) by mouth 2 (two) times daily.   No facility-administered medications prior to visit.    Review of Systems  Last metabolic panel Lab Results  Component Value Date   GLUCOSE 99 11/29/2022   NA 139 11/29/2022   K 4.8 11/29/2022   CL 104 11/29/2022   CO2 22 11/29/2022   BUN  13 11/29/2022   CREATININE 0.69 11/29/2022   EGFR 110 11/29/2022   CALCIUM  9.4 11/29/2022   PHOS 3.7 06/26/2015   PROT 6.9 11/29/2022   ALBUMIN 4.3 11/29/2022   LABGLOB 2.6 11/29/2022   AGRATIO 1.7 11/29/2022   BILITOT 0.2 11/29/2022   ALKPHOS 121 11/29/2022   AST 14 11/29/2022   ALT 14 11/29/2022   ANIONGAP 6 07/11/2016   Last lipids Lab Results  Component Value Date   CHOL 179 11/29/2022   HDL 62 11/29/2022   LDLCALC 104 (H) 11/29/2022   TRIG 66 11/29/2022   CHOLHDL 2.9 11/29/2022   Last hemoglobin A1c Lab Results  Component Value Date   HGBA1C 5.7 (H) 11/29/2022    Last thyroid  functions Lab Results  Component Value Date   TSH 18.900 (H) 11/29/2022        Objective    BP 126/62   Pulse 73   Ht 5\' 6"  (1.676 m)   Wt 243 lb (110.2 kg)   SpO2 100%   BMI 39.22 kg/m  BP Readings from Last 3 Encounters:  02/14/24 126/62  09/09/23 126/77  09/08/23 118/79   Wt Readings from Last 3 Encounters:  02/14/24 243 lb (110.2 kg)  09/09/23 238 lb (108 kg)  03/29/23 263 lb (119.3 kg)        Physical Exam  General: Alert, no acute distress Cardio: Normal S1 and S2, RRR, no r/m/g Pulm: CTAB, normal work of breathing ABD: soft, abdomen is not distended, there is no tenderness to palpation, normal BS  Extremities: no LE edema    No results found for any visits on 02/14/24.  Assessment & Plan     Problem List Items Addressed This Visit       Endocrine   Hypothyroidism - Primary     Other   Vitamin D  deficiency   BMI 39.0-39.9,adult   Relevant Medications   tirzepatide (ZEPBOUND) 2.5 MG/0.5ML injection vial   Other Relevant Orders   Comprehensive metabolic panel with GFR   Hemoglobin A1c   Lipid Profile   Anemia   Other Visit Diagnoses       Encounter for screening mammogram for malignant neoplasm of breast       Relevant Orders   MM 3D SCREENING MAMMOGRAM BILATERAL BREAST     Screening for deficiency anemia       Relevant Orders   CBC     Screening for lipid disorders       Relevant Orders   Lipid Profile       Assessment & Plan Obesity Obesity with a BMI of 39.22. Previous weight loss of 33 pounds with phentermine , but current weight gain post-discontinuation. Discussed GLP-1 receptor agonist (Zepbound) for weight management. She is motivated to lose weight and willing to pay out of pocket. Discussed side effects of GLP-1 receptor agonists, including nausea, constipation, and diarrhea. Plan to start with 2.5 mg dose and titrate as tolerated. Discussed cost: 2.5 mg at $399/month, 5 mg at $499/month. - Prescribe Zepbound  2.5 mg and initiate prior authorization - Consider direct pharmacy option if insurance denies - Schedule follow-up one month after starting Zepbound - Order fasting labs: lipid panel, CMP, A1c, CBC  Prediabetes Prediabetes with A1c of 5.7%. Emphasized weight management to prevent diabetes progression. She understands the need for a healthy lifestyle. - Include A1c in fasting labs  Hypothyroidism post-thyroidectomy Hypothyroidism managed with Synthroid  post-thyroidectomy for papillary thyroid  carcinoma. She follows up with endocrinologist. No current issues with Synthroid .  Papillary thyroid   carcinoma Papillary thyroid  carcinoma treated with thyroidectomy and radioactive iodine  in 2016. No current issues. Regular endocrinologist follow-up.  Precancerous colon polyps Precancerous colon polyps removed during colonoscopy. She is concerned about recurrence and inquires about surveillance intervals. Colonoscopy recommended every 5 years unless pathology indicates otherwise. - Recommend colonoscopy every 5 years unless pathology indicates otherwise     Return in about 6 weeks (around 03/27/2024) for CPE & labs .         Mimi Alt, MD  [facility] [phone] (phone) [phone] (fax)  [facility]

## 2024-02-17 ENCOUNTER — Telehealth: Payer: Self-pay

## 2024-02-17 ENCOUNTER — Other Ambulatory Visit (HOSPITAL_COMMUNITY): Payer: Self-pay

## 2024-02-17 NOTE — Telephone Encounter (Signed)
 Pharmacy Patient Advocate Encounter   Received notification from RX Request Messages that prior authorization for Zepbound 2.5 mg/0.5 ml auto injector is required/requested.   Insurance verification completed.   The patient is insured through [facility] .   Per test claim:

## 2024-02-17 NOTE — Telephone Encounter (Signed)
 Please see the message below.

## 2024-02-20 ENCOUNTER — Encounter: Payer: Self-pay | Admitting: Family Medicine

## 2024-02-20 ENCOUNTER — Other Ambulatory Visit: Payer: Self-pay

## 2024-02-20 ENCOUNTER — Other Ambulatory Visit: Payer: Self-pay | Admitting: Family Medicine

## 2024-02-20 DIAGNOSIS — Z6839 Body mass index (BMI) 39.0-39.9, adult: Secondary | ICD-10-CM

## 2024-02-20 MED FILL — Levothyroxine Sodium Tab 200 MCG: ORAL | 30 days supply | Qty: 30 | Fill #0 | Status: AC

## 2024-02-20 NOTE — Telephone Encounter (Signed)
 Please see the message below.

## 2024-02-20 NOTE — Telephone Encounter (Signed)
 Mychart message updating patient on denial for zepbound sent. Requested patient contact insurance for covered options and follow up in office to discuss

## 2024-02-21 ENCOUNTER — Other Ambulatory Visit: Payer: Self-pay

## 2024-02-23 MED ORDER — TIRZEPATIDE-WEIGHT MANAGEMENT 2.5 MG/0.5ML ~~LOC~~ SOLN: 2.5000 mg | SUBCUTANEOUS | 2 refills | Status: DC

## 2024-03-28 ENCOUNTER — Encounter: Admitting: Family Medicine

## 2024-04-05 ENCOUNTER — Other Ambulatory Visit: Payer: Self-pay

## 2024-04-05 ENCOUNTER — Other Ambulatory Visit: Payer: Self-pay | Admitting: Family Medicine

## 2024-04-06 ENCOUNTER — Other Ambulatory Visit: Payer: Self-pay

## 2024-04-09 ENCOUNTER — Other Ambulatory Visit: Payer: Self-pay

## 2024-04-09 MED FILL — Levothyroxine Sodium Tab 200 MCG: ORAL | 30 days supply | Qty: 30 | Fill #0 | Status: CN

## 2024-04-23 ENCOUNTER — Other Ambulatory Visit: Payer: Self-pay

## 2024-04-25 ENCOUNTER — Other Ambulatory Visit: Payer: Self-pay

## 2024-04-25 MED FILL — Levothyroxine Sodium Tab 200 MCG: ORAL | 30 days supply | Qty: 30 | Fill #0 | Status: AC

## 2024-05-09 ENCOUNTER — Encounter: Admitting: Family Medicine

## 2024-06-12 ENCOUNTER — Other Ambulatory Visit: Payer: Self-pay | Admitting: Family Medicine

## 2024-06-12 ENCOUNTER — Other Ambulatory Visit: Payer: Self-pay

## 2024-06-12 MED ORDER — LEVOTHYROXINE SODIUM 200 MCG PO TABS
200.0000 ug | ORAL_TABLET | Freq: Every day | ORAL | 0 refills | Status: DC
  Filled 2024-06-12: qty 30, 30d supply, fill #0

## 2024-06-27 ENCOUNTER — Ambulatory Visit (INDEPENDENT_AMBULATORY_CARE_PROVIDER_SITE_OTHER): Admitting: Family Medicine

## 2024-06-27 ENCOUNTER — Other Ambulatory Visit: Payer: Self-pay

## 2024-06-27 VITALS — BP 142/90 | HR 66 | Temp 98.1°F | Ht 66.0 in | Wt 250.6 lb

## 2024-06-27 DIAGNOSIS — E890 Postprocedural hypothyroidism: Secondary | ICD-10-CM | POA: Diagnosis not present

## 2024-06-27 DIAGNOSIS — Z6839 Body mass index (BMI) 39.0-39.9, adult: Secondary | ICD-10-CM | POA: Diagnosis not present

## 2024-06-27 DIAGNOSIS — Z13220 Encounter for screening for lipoid disorders: Secondary | ICD-10-CM | POA: Diagnosis not present

## 2024-06-27 DIAGNOSIS — Z0000 Encounter for general adult medical examination without abnormal findings: Secondary | ICD-10-CM | POA: Diagnosis not present

## 2024-06-27 DIAGNOSIS — R7303 Prediabetes: Secondary | ICD-10-CM | POA: Diagnosis not present

## 2024-06-27 DIAGNOSIS — Z13228 Encounter for screening for other metabolic disorders: Secondary | ICD-10-CM | POA: Diagnosis not present

## 2024-06-27 DIAGNOSIS — Z1329 Encounter for screening for other suspected endocrine disorder: Secondary | ICD-10-CM | POA: Diagnosis not present

## 2024-06-27 DIAGNOSIS — R030 Elevated blood-pressure reading, without diagnosis of hypertension: Secondary | ICD-10-CM

## 2024-06-27 DIAGNOSIS — E559 Vitamin D deficiency, unspecified: Secondary | ICD-10-CM

## 2024-06-27 DIAGNOSIS — Z0001 Encounter for general adult medical examination with abnormal findings: Secondary | ICD-10-CM

## 2024-06-27 DIAGNOSIS — Z130 Encounter for screening for diseases of the blood and blood-forming organs and certain disorders involving the immune mechanism: Secondary | ICD-10-CM

## 2024-06-27 MED ORDER — PHENTERMINE HCL 37.5 MG PO CAPS
37.5000 mg | ORAL_CAPSULE | ORAL | 0 refills | Status: DC
  Filled 2024-06-27: qty 30, 30d supply, fill #0

## 2024-06-27 MED ORDER — TOPIRAMATE 25 MG PO TABS
25.0000 mg | ORAL_TABLET | Freq: Two times a day (BID) | ORAL | 2 refills | Status: AC
  Filled 2024-06-27: qty 120, 60d supply, fill #0
  Filled 2024-10-23: qty 120, 60d supply, fill #1

## 2024-06-27 NOTE — Patient Instructions (Addendum)
 It was a pleasure to see you today!  Thank you for choosing [facility] for your primary care.   Today you were seen for your annual physical  Please review the attached information regarding helpful preventive health topics.     Best Wishes,   Dr. Roxan Hockey

## 2024-06-27 NOTE — Progress Notes (Signed)
 Complete physical exam   Patient: Brooke Gordon   DOB: 03/01/1981   43 y.o. Female  MRN: [id number] Visit Date: 06/27/2024  Today's healthcare provider: Rockie Agent, MD   Chief Complaint  Patient presents with   Annual Exam    Patient presents for annual exam.  Eating  low carb, higher protein with fruits and veggies. Walking 3-5 x weekly. Sleeping well, feeling well overall    Subjective    Brooke Gordon is a 43 y.o. female who presents today for a complete physical exam.    She does have additional problems to discuss today.   Discussed the use of AI scribe software for clinical note transcription with the patient, who gave verbal consent to proceed.  History of Present Illness Brooke Gordon is a 43 year old female who presents for an annual physical exam.  She recently visited the emergency department and was diagnosed with a kidney problem. She was prescribed pain medication to last until the following Tuesday.  She has experienced weight gain after discontinuing phentermine , which had previously reduced her weight to 233 pounds. Her weight has since increased to 246 pounds. Despite maintaining a low-carb, high-protein diet with fruits and vegetables and exercising three to five times weekly, phentermine  is no longer effective. She is considering alternative weight management options due to financial constraints. Her exercise routine includes walking at least three times a week, aiming for a minimum of 30 minutes per session, often exceeding this goal. She wants to increase her exercise frequency and duration to aid in weight loss.  She has a history of hypothyroidism and has previously used Wellbutrin  with metformin  without significant weight loss. She has not used Topamax  or topiramate  before.  She expresses concern about her blood pressure, which was previously normal but has been elevated recently, possibly due to a cold and the use of over-the-counter  decongestants containing phenylephrine . She is currently on the tail end of a cold, which has lasted about ten days, and has been using over-the-counter medications containing guaifenesin and phenylephrine . No history of eating disorders or seizures.     Past Medical History:  Diagnosis Date   Anemia    pregnancy only   Complication of anesthesia    Hypothyroidism    h/o thyroidectomy   Macrosomia    Malignant neoplasm of thyroid  gland (HCC)    Papillary carcinoma of thyroid  (HCC) 2016   Pneumonia    08/2020   PONV (postoperative nausea and vomiting)    Past Surgical History:  Procedure Laterality Date   BILATERAL SALPINGECTOMY     COLONOSCOPY WITH PROPOFOL  N/A 01/15/2021   Procedure: COLONOSCOPY WITH PROPOFOL ;  Surgeon: Janalyn Keene KATHEE, MD;  Location: [facility] ENDOSCOPY;  Service: Gastroenterology;  Laterality: N/A;   COLPOSCOPY  07/2012   neg   DILATATION AND CURETTAGE/HYSTEROSCOPY WITH MINERVA N/A 12/18/2020   Procedure: DILATATION AND CURETTAGE/HYSTEROSCOPY WITH MINERVA;  Surgeon: Lake Read, MD;  Location: [facility] ORS;  Service: Gynecology;  Laterality: N/A;   LAPAROSCOPIC TUBAL LIGATION N/A 02/03/2017   Procedure: LAPAROSCOPIC TUBAL LIGATION via SALPINGECTOMY;  Surgeon: Read Lake, MD;  Location: [facility] ORS;  Service: Gynecology;  Laterality: N/A;   THYROIDECTOMY N/A 06/25/2015   Procedure: Total thyroidectomy with laryngeal nerve monitoring ;  Surgeon: Deward Dolly, MD;  Location: [facility] ORS;  Service: ENT;  Laterality: N/A;   TUBAL LIGATION  02/03/2017   WISDOM TOOTH EXTRACTION     Social History   Socioeconomic History   Marital status:  Married    Spouse name: Not on file   Number of children: 5   Years of education: Not on file   Highest education level: Bachelor's degree (e.g., BA, AB, BS)  Occupational History   Not on file  Tobacco Use   Smoking status: Former    Current packs/day: 0.00    Average packs/day: 0.3 packs/day for 11.7 years (3.0 ttl pk-yrs)     Types: Cigarettes    Start date: 06/23/1998    Quit date: 06/23/2008    Years since quitting: 16.0   Smokeless tobacco: Never  Vaping Use   Vaping status: Never Used  Substance and Sexual Activity   Alcohol use: Yes    Comment: very rare   Drug use: No   Sexual activity: Yes    Birth control/protection: Surgical  Other Topics Concern   Not on file  Social History Narrative   Not on file   Social Drivers of Health   Financial Resource Strain: Low Risk  (06/27/2024)   Overall Financial Resource Strain (CARDIA)    Difficulty of Paying Living Expenses: Not hard at all  Food Insecurity: No Food Insecurity (06/27/2024)   Hunger Vital Sign    Worried About Running Out of Food in the Last Year: Never true    Ran Out of Food in the Last Year: Never true  Transportation Needs: No Transportation Needs (06/27/2024)   PRAPARE - Administrator, Civil Service (Medical): No    Lack of Transportation (Non-Medical): No  Physical Activity: Insufficiently Active (06/27/2024)   Exercise Vital Sign    Days of Exercise per Week: 4 days    Minutes of Exercise per Session: 30 min  Stress: No Stress Concern Present (06/27/2024)   [employer] of Occupational Health - Occupational Stress Questionnaire    Feeling of Stress: Not at all  Social Connections: Socially Integrated (06/27/2024)   Social Connection and Isolation Panel    Frequency of Communication with Friends and Family: Three times a week    Frequency of Social Gatherings with Friends and Family: Once a week    Attends Religious Services: More than 4 times per year    Active Member of [employer] or Organizations: Yes    Attends Engineer, structural: More than 4 times per year    Marital Status: Married  Catering manager Violence: Not on file   Family Status  Relation Name Status   Mother  Alive   Father  Alive   Other Latarsha Zani Other       Thyroid  [place] 2016  No partnership data on file   Family History  Problem  Relation Age of Onset   Cancer Other    Allergies  Allergen Reactions   Codeine Other (See Comments)    Childhood reaction  Reaction:  Unknown      Medications: Outpatient Medications Prior to Visit  Medication Sig   levothyroxine  (SYNTHROID ) 200 MCG tablet Take 1 tablet (200 mcg total) by mouth daily. Take on an empty stomach with a glass of water at least 30-60 minutes before breakfast.   tirzepatide  (ZEPBOUND ) 2.5 MG/0.5ML injection vial Inject 2.5 mg into the skin once a week.   [DISCONTINUED] phentermine  37.5 MG capsule Take 1 capsule (37.5 mg total) by mouth every morning.   No facility-administered medications prior to visit.    Review of Systems  Last CBC Lab Results  Component Value Date   WBC 7.7 11/29/2022   HGB 12.3 11/29/2022   HCT  37.7 11/29/2022   MCV 88 11/29/2022   MCH 28.7 11/29/2022   RDW 12.9 11/29/2022   PLT 261 11/29/2022   Last metabolic panel Lab Results  Component Value Date   GLUCOSE 99 11/29/2022   NA 139 11/29/2022   K 4.8 11/29/2022   CL 104 11/29/2022   CO2 22 11/29/2022   BUN 13 11/29/2022   CREATININE 0.69 11/29/2022   EGFR 110 11/29/2022   CALCIUM  9.4 11/29/2022   PHOS 3.7 06/26/2015   PROT 6.9 11/29/2022   ALBUMIN 4.3 11/29/2022   LABGLOB 2.6 11/29/2022   AGRATIO 1.7 11/29/2022   BILITOT 0.2 11/29/2022   ALKPHOS 121 11/29/2022   AST 14 11/29/2022   ALT 14 11/29/2022   ANIONGAP 6 07/11/2016   Last lipids Lab Results  Component Value Date   CHOL 179 11/29/2022   HDL 62 11/29/2022   LDLCALC 104 (H) 11/29/2022   TRIG 66 11/29/2022   CHOLHDL 2.9 11/29/2022   Last hemoglobin A1c Lab Results  Component Value Date   HGBA1C 5.7 (H) 11/29/2022   Last thyroid  functions Lab Results  Component Value Date   TSH 18.900 (H) 11/29/2022       Objective    BP (!) 142/90 (Cuff Size: Large)   Pulse 66   Temp 98.1 F (36.7 C) (Oral)   Ht 5' 6 (1.676 m)   Wt 250 lb 9.6 oz (113.7 kg)   LMP 06/24/2024 (Exact Date)   SpO2  100%   BMI 40.45 kg/m  BP Readings from Last 3 Encounters:  06/27/24 (!) 142/90  02/14/24 126/62  09/09/23 126/77   Wt Readings from Last 3 Encounters:  06/27/24 250 lb 9.6 oz (113.7 kg)  02/14/24 243 lb (110.2 kg)  09/09/23 238 lb (108 kg)        Physical Exam Vitals reviewed.  Constitutional:      General: She is not in acute distress.    Appearance: Normal appearance. She is not ill-appearing, toxic-appearing or diaphoretic.  HENT:     Head: Normocephalic and atraumatic.     Right Ear: Tympanic membrane and external ear normal. There is no impacted cerumen.     Left Ear: Tympanic membrane and external ear normal. There is no impacted cerumen.     Nose: Congestion present.     Mouth/Throat:     Mouth: Mucous membranes are moist.     Pharynx: Oropharynx is clear. No oropharyngeal exudate or posterior oropharyngeal erythema.  Eyes:     General: No scleral icterus.    Extraocular Movements: Extraocular movements intact.     Conjunctiva/sclera: Conjunctivae normal.     Pupils: Pupils are equal, round, and reactive to light.  Cardiovascular:     Rate and Rhythm: Normal rate and regular rhythm.     Pulses: Normal pulses.     Heart sounds: Normal heart sounds. No murmur heard.    No friction rub. No gallop.  Pulmonary:     Effort: Pulmonary effort is normal. No respiratory distress.     Breath sounds: Normal breath sounds. No wheezing, rhonchi or rales.  Abdominal:     General: Bowel sounds are normal. There is no distension.     Palpations: Abdomen is soft. There is no mass.     Tenderness: There is no abdominal tenderness. There is no guarding.  Musculoskeletal:        General: No deformity.     Cervical back: Normal range of motion and neck supple.     Right lower  leg: No edema.     Left lower leg: No edema.  Lymphadenopathy:     Cervical: No cervical adenopathy.  Skin:    General: Skin is warm.     Capillary Refill: Capillary refill takes less than 2 seconds.      Findings: No erythema or rash.  Neurological:     General: No focal deficit present.     Mental Status: She is alert and oriented to person, place, and time.     Cranial Nerves: Cranial nerves 2-12 are intact. No cranial nerve deficit or facial asymmetry.     Motor: Motor function is intact. No weakness.     Gait: Gait normal.  Psychiatric:        Mood and Affect: Mood normal.        Behavior: Behavior normal.       Last depression screening scores    06/27/2024    9:41 AM 02/14/2024    1:38 PM 09/09/2023    1:42 PM  PHQ 2/9 Scores  PHQ - 2 Score 0 0 0  PHQ- 9 Score 0 0 0    Last fall risk screening    06/27/2024    9:41 AM  Fall Risk   Falls in the past year? 0  Number falls in past yr: 0  Injury with Fall? 0  Risk for fall due to : No Fall Risks  Follow up Falls evaluation completed    Last Audit-C alcohol use screening    06/27/2024    9:27 AM  Alcohol Use Disorder Test (AUDIT)  1. How often do you have a drink containing alcohol? 2  2. How many drinks containing alcohol do you have on a typical day when you are drinking? 0  3. How often do you have six or more drinks on one occasion? 0  AUDIT-C Score 2      Patient-reported   A score of 3 or more in women, and 4 or more in men indicates increased risk for alcohol abuse, EXCEPT if all of the points are from question 1   No results found for any visits on 06/27/24.  Assessment & Plan    Routine Health Maintenance and Physical Exam  Immunization History  Administered Date(s) Administered   Influenza-Unspecified 06/29/2018, 08/11/2022   PFIZER(Purple Top)SARS-COV-2 Vaccination 06/26/2020, 08/01/2020   Tdap 07/25/2018    Health Maintenance  Topic Date Due   COVID-19 Vaccine (3 - Pfizer risk series) 07/13/2024 (Originally 08/29/2020)   Influenza Vaccine  01/15/2025 (Originally 05/18/2024)   Hepatitis B Vaccines 19-59 Average Risk (1 of 3 - 19+ 3-dose series) 06/27/2025 (Originally 02/08/1998)   HPV  VACCINES (1 - Risk 3-dose SCDM series) 06/27/2025 (Originally 02/08/2006)   Cervical Cancer Screening (HPV/Pap Cotest)  10/15/2025   Colonoscopy  01/15/2026   DTaP/Tdap/Td (2 - Td or Tdap) 07/25/2028   Hepatitis C Screening  Completed   HIV Screening  Completed   Pneumococcal Vaccine  Aged Out   Meningococcal B Vaccine  Aged Out    Problem List Items Addressed This Visit     Annual physical exam   Relevant Orders   Lipid Panel With LDL/HDL Ratio   HgB A1c   Comprehensive Metabolic Panel (CMET)   TSH   BMI 39.0-39.9,adult   Relevant Medications   topiramate  (TOPAMAX ) 25 MG tablet   phentermine  37.5 MG capsule   Other Relevant Orders   Lipid Panel With LDL/HDL Ratio   HgB A1c   Comprehensive Metabolic Panel (CMET)  TSH   Hypothyroidism - Primary   Relevant Orders   TSH   Prediabetes   Relevant Orders   HgB A1c   Vitamin D  deficiency   Relevant Orders   VITAMIN D  25 Hydroxy (Vit-D Deficiency, Fractures)   Other Visit Diagnoses       Screening for lipid disorders       Relevant Orders   Lipid Panel With LDL/HDL Ratio     Screening for endocrine/metabolic/immunity disorders       Relevant Orders   Comprehensive Metabolic Panel (CMET)     Elevated blood pressure reading           Assessment and Plan Assessment & Plan Adult Wellness Visit Annual physical examination conducted. She follows a low-carb, high-protein diet with fruits and vegetables and exercises three to five times weekly. Menopause and its clinical diagnosis based on menstrual irregularities were discussed. - Order cholesterol, A1c, vitamin D , CMP, and thyroid  function tests - Discuss menopause symptoms and clinical diagnosis  Obesity with BMI 39.0-39.9, adult Reports weight gain after stopping phentermine . Financial constraints prevent use of weight loss injections. Phentermine  previously effective but no longer working. Considered Wellbutrin  with naltrexone and Topamax  with phentermine . Topamax  and  phentermine  combination chosen due to previous lack of efficacy with Wellbutrin  and metformin . - Prescribe Topamax  25 mg daily for one week, then increase to 25 mg twice daily - Prescribe phentermine  37.5 mg daily - Increase exercise to 200-240 minutes per week - Monitor for side effects of Topamax  such as rashes and cognitive effects  Prediabetes A1c to be checked as part of routine labs to monitor prediabetes status. - Order A1c  Elevated blood pressure reading Elevated blood pressure likely secondary to phenylephrine  use for cold symptoms. - Monitor blood pressure after discontinuation of phenylephrine   Acute upper respiratory infection (cold) She is on the tail end of a cold, currently on day ten. Symptoms include nasal congestion. No wheezing or crackles on examination. Phenylephrine  use likely contributing to elevated blood pressure. - Discontinue phenylephrine  - Monitor symptoms  Postprocedural hypothyroidism No specific discussion or changes in management for hypothyroidism during this visit.  Vitamin D  deficiency Vitamin D  levels to be checked as part of routine labs. - Order vitamin D  level       Return in about 2 months (around 08/27/2024) for Weight MGMT.       Rockie Agent, MD  [facility] [phone] (phone) [phone] (fax)  [facility]

## 2024-06-28 ENCOUNTER — Ambulatory Visit: Payer: Self-pay | Admitting: Family Medicine

## 2024-06-28 LAB — COMPREHENSIVE METABOLIC PANEL WITH GFR
ALT: 13 IU/L (ref 0–32)
AST: 18 IU/L (ref 0–40)
Albumin: 4.5 g/dL (ref 3.9–4.9)
Alkaline Phosphatase: 102 IU/L (ref 44–121)
BUN/Creatinine Ratio: 11 (ref 9–23)
BUN: 10 mg/dL (ref 6–24)
Bilirubin Total: 0.3 mg/dL (ref 0.0–1.2)
CO2: 23 mmol/L (ref 20–29)
Calcium: 9.1 mg/dL (ref 8.7–10.2)
Chloride: 102 mmol/L (ref 96–106)
Creatinine, Ser: 0.9 mg/dL (ref 0.57–1.00)
Globulin, Total: 2.6 g/dL (ref 1.5–4.5)
Glucose: 91 mg/dL (ref 70–99)
Potassium: 4.2 mmol/L (ref 3.5–5.2)
Sodium: 138 mmol/L (ref 134–144)
Total Protein: 7.1 g/dL (ref 6.0–8.5)
eGFR: 80 mL/min/1.73 (ref >59)

## 2024-06-28 LAB — LIPID PANEL WITH LDL/HDL RATIO
Cholesterol, Total: 213 mg/dL — ABNORMAL HIGH (ref 100–199)
HDL: 52 mg/dL (ref >39)
LDL Chol Calc (NIH): 137 mg/dL — ABNORMAL HIGH (ref 0–99)
LDL/HDL Ratio: 2.6 ratio (ref 0.0–3.2)
Triglycerides: 135 mg/dL (ref 0–149)
VLDL Cholesterol Cal: 24 mg/dL (ref 5–40)

## 2024-06-28 LAB — VITAMIN D 25 HYDROXY (VIT D DEFICIENCY, FRACTURES): Vit D, 25-Hydroxy: 31 ng/mL (ref 30.0–100.0)

## 2024-06-28 LAB — TSH: TSH: 37.5 u[IU]/mL — ABNORMAL HIGH (ref 0.450–4.500)

## 2024-06-28 LAB — HEMOGLOBIN A1C
Est. average glucose Bld gHb Est-mCnc: 105 mg/dL
Hgb A1c MFr Bld: 5.3 % (ref 4.8–5.6)

## 2024-07-25 ENCOUNTER — Other Ambulatory Visit: Payer: Self-pay | Admitting: Family Medicine

## 2024-07-26 ENCOUNTER — Other Ambulatory Visit: Payer: Self-pay | Admitting: Family Medicine

## 2024-07-26 ENCOUNTER — Other Ambulatory Visit: Payer: Self-pay

## 2024-07-27 ENCOUNTER — Other Ambulatory Visit: Payer: Self-pay

## 2024-07-27 MED FILL — Levothyroxine Sodium Tab 200 MCG: ORAL | 30 days supply | Qty: 30 | Fill #0 | Status: AC

## 2024-08-07 ENCOUNTER — Other Ambulatory Visit: Payer: Self-pay

## 2024-08-07 ENCOUNTER — Other Ambulatory Visit: Payer: Self-pay | Admitting: Family Medicine

## 2024-08-07 DIAGNOSIS — Z6839 Body mass index (BMI) 39.0-39.9, adult: Secondary | ICD-10-CM

## 2024-08-07 MED ORDER — PHENTERMINE HCL 37.5 MG PO CAPS
37.5000 mg | ORAL_CAPSULE | ORAL | 0 refills | Status: DC
  Filled 2024-08-07: qty 30, 30d supply, fill #0

## 2024-08-29 ENCOUNTER — Encounter: Payer: Self-pay | Admitting: Family Medicine

## 2024-08-29 ENCOUNTER — Other Ambulatory Visit: Payer: Self-pay

## 2024-08-29 ENCOUNTER — Ambulatory Visit: Admitting: Family Medicine

## 2024-08-29 VITALS — BP 130/76 | HR 73 | Temp 98.2°F | Ht 66.0 in | Wt 238.4 lb

## 2024-08-29 DIAGNOSIS — E66812 Obesity, class 2: Secondary | ICD-10-CM

## 2024-08-29 DIAGNOSIS — K760 Fatty (change of) liver, not elsewhere classified: Secondary | ICD-10-CM

## 2024-08-29 DIAGNOSIS — E890 Postprocedural hypothyroidism: Secondary | ICD-10-CM

## 2024-08-29 DIAGNOSIS — Z6838 Body mass index (BMI) 38.0-38.9, adult: Secondary | ICD-10-CM | POA: Diagnosis not present

## 2024-08-29 DIAGNOSIS — R1011 Right upper quadrant pain: Secondary | ICD-10-CM | POA: Insufficient documentation

## 2024-08-29 DIAGNOSIS — E7800 Pure hypercholesterolemia, unspecified: Secondary | ICD-10-CM | POA: Diagnosis not present

## 2024-08-29 MED ORDER — LEVOTHYROXINE SODIUM 200 MCG PO TABS
200.0000 ug | ORAL_TABLET | Freq: Every day | ORAL | 2 refills | Status: AC
  Filled 2024-08-29: qty 90, 90d supply, fill #0

## 2024-08-29 NOTE — Progress Notes (Signed)
 Established patient visit   Patient: Brooke Gordon   DOB: [birth year]   43 y.o. Female  MRN: [id number] Visit Date: 08/29/2024  Today's healthcare provider: Rockie Agent, MD   Chief Complaint  Patient presents with   Medical Management of Chronic Issues    Patient is present to follow up on weight mgmt, doing okay on phentermine  and topamax .    Weight Check   Abdominal Pain    Patient reports RUQ pain dull ache radiating to back, constant. Some relief from tylenol  or ibuprofen , food does not seem to aggravate it. Not a bad pain but is very aggravating for patient. Patient is concerned for liver/ gallbladder and does report social alcohol use maybe monthly    Subjective     HPI     Medical Management of Chronic Issues    Additional comments: Patient is present to follow up on weight mgmt, doing okay on phentermine  and topamax .         Abdominal Pain    Additional comments: Patient reports RUQ pain dull ache radiating to back, constant. Some relief from tylenol  or ibuprofen , food does not seem to aggravate it. Not a bad pain but is very aggravating for patient. Patient is concerned for liver/ gallbladder and does report social alcohol use maybe monthly       Last edited by Cherry Chiquita HERO, CMA on 08/29/2024 10:33 AM.       Discussed the use of AI scribe software for clinical note transcription with the patient, who gave verbal consent to proceed.  History of Present Illness Brooke Gordon is a 43 year old female with class two obesity and hepatic steatosis who presents with abdominal pain.  She has been on phentermine  37.5 mg and Topamax  25 mg, taken once daily in the morning. She has lost weight from 250 pounds to 238 pounds over the past two months. Her physical activity includes walking six days a week, aiming for at least two miles per day, although recent work commitments have limited her activity.  She experiences dull, constant right upper quadrant  abdominal pain that radiates to her back, present for at least a month. The pain is relieved by Tylenol  but not aggravated by ibuprofen  and is not related to food intake. She has a history of hepatic steatosis and is concerned about her liver and gallbladder. She consumes alcohol monthly, typically craft beer, and noted numbness in her hands after consuming more alcohol than usual at a recent event.  She has a history of hypothyroidism, managed with Synthroid  200 mcg daily, and a past medical history of multifocal papillary thyroid  cancer with no detectable thyroid  remnant or lymph node involvement.  Her blood pressure was noted to be elevated at 142/82. No changes in bowel or urinary habits. She has two sons, aged 91 and 83, who are transitioning from pediatric to adult care. 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     Past Medical History:  Diagnosis Date   Anemia    pregnancy only   Complication of anesthesia    Hypothyroidism    h/o thyroidectomy   Macrosomia    Malignant neoplasm of thyroid  gland (HCC)    Papillary carcinoma of thyroid  (HCC) 2016   Pneumonia    08/2020   PONV (postoperative nausea and vomiting)     Medications: Outpatient Medications Prior to Visit  Medication Sig   phentermine  37.5 MG capsule Take 1 capsule (  37.5 mg total) by mouth every morning.   topiramate  (TOPAMAX ) 25 MG tablet Take 1 tablet (25 mg total) by mouth 2 (two) times daily.   [DISCONTINUED] levothyroxine  (SYNTHROID ) 200 MCG tablet Take 1 tablet (200 mcg total) by mouth daily. Take on an empty stomach with a glass of water at least 30-60 minutes before breakfast.   [DISCONTINUED] tirzepatide  (ZEPBOUND ) 2.5 MG/0.5ML injection vial Inject 2.5 mg into the skin once a week.   No facility-administered medications prior to visit.    Review of Systems      Objective    BP 130/76 (BP Location: Left Arm, Patient Position: Sitting, Cuff Size: Normal)  Comment: manual  Pulse 73   Temp 98.2 F (36.8 C) (Oral)   Ht 5' 6 (1.676 m)   Wt 238 lb 6.4 oz (108.1 kg)   LMP 08/14/2024 (Exact Date)   SpO2 100%   BMI 38.48 kg/m  BP Readings from Last 3 Encounters:  08/29/24 130/76  06/27/24 (!) 142/90  02/14/24 126/62   Wt Readings from Last 3 Encounters:  08/29/24 238 lb 6.4 oz (108.1 kg)  06/27/24 250 lb 9.6 oz (113.7 kg)  02/14/24 243 lb (110.2 kg)        Physical Exam  Physical Exam VITALS: BP- 142/82 MEASUREMENTS: Weight- 238, BMI- 38.0. HEENT: No scleral icterus, no jaundice. ABDOMEN: Soft, non-tender, non-distended, normal bowel sounds, no costovertebral angle tenderness.    No results found for any visits on 08/29/24.  Assessment & Plan     Problem List Items Addressed This Visit     Class 2 severe obesity with serious comorbidity and body mass index (BMI) of 38.0 to 38.9 in adult - Primary   Hepatic steatosis   Relevant Orders   US  Abdomen Limited RUQ (LIVER/GB)   Lipid panel   CMP14+EGFR   Hypothyroidism   Relevant Medications   levothyroxine  (SYNTHROID ) 200 MCG tablet   RUQ pain   Relevant Orders   US  Abdomen Limited RUQ (LIVER/GB)   Lipid panel   CMP14+EGFR   Lipase   Other Visit Diagnoses       Elevated LDL cholesterol level           Assessment & Plan Class 2 Obesity Chronic class 2 obesity with a BMI of 38. Weight has decreased from 250 pounds to 238 pounds over the past two months. Currently on phentermine  and Topamax  for weight management. Plans to start GLP-1 agonist therapy in January, with a preference for tesofensine due to its efficacy in weight loss. Discussed cost considerations and insurance coverage, opting for direct pharmacy for affordability. - Continue phentermine  and Topamax . - Will start GLP-1 agonist therapy in January, preferably tesofensine. - Encouraged increased physical activity, aiming for at least two miles of walking six days a week.  Right upper quadrant abdominal  pain Subacute dull right upper quadrant abdominal pain radiating to the back, present for at least a month. Pain is not food-related and is not completely relieved by Tylenol . Differential diagnosis includes gallbladder issues or hepatic inflammation. Previous ultrasound in 2017 showed fatty liver. No tenderness on examination, and no jaundice or scleral icterus. - Ordered right upper quadrant ultrasound. - Ordered comprehensive metabolic panel (CMP) and liver function tests (LFTs). - Ordered lipid panel. - Consider trial of gas medication for potential gas-related discomfort.  Fatty liver (hepatic steatosis) Chronic  Hepatic steatosis with previous elevated liver function tests. Current abdominal pain may be related to hepatic inflammation, possibly exacerbated by alcohol consumption. No jaundice or  scleral icterus on examination. - Ordered comprehensive metabolic panel (CMP) and liver function tests (LFTs). - Advised moderation of alcohol consumption.   Postprocedural hypothyroidism Chronic  Managed with Synthroid  200 mcg. Prefers management by primary care provider rather than endocrinologist. Previous TSH levels slightly elevated. - Continue Synthroid  200 mcg daily. - Scheduled follow-up visit with endocrinologist for transition of care. - Monitor TSH levels regularly.  Pure hypercholesterolemia chronic Elevated cholesterol levels. Currently fasting for lipid panel to reassess cholesterol levels. - Ordered lipid panel.     Return in about 2 months (around 10/29/2024) for RUQ PAIN .         Rockie Agent, MD  [facility] [phone] (phone) [phone] (fax)  [facility]

## 2024-08-29 NOTE — Patient Instructions (Signed)
 To keep you healthy, please keep in mind the following health maintenance items that you are due for:   Health Maintenance Due  Topic Date Due   COVID-19 Vaccine (3 - Pfizer risk series) 08/29/2020     Best Wishes,   Dr. Lang

## 2024-08-30 LAB — CMP14+EGFR
ALT: 11 IU/L (ref 0–32)
AST: 14 IU/L (ref 0–40)
Albumin: 4.3 g/dL (ref 3.9–4.9)
Alkaline Phosphatase: 100 IU/L (ref 41–116)
BUN/Creatinine Ratio: 11 (ref 9–23)
BUN: 8 mg/dL (ref 6–24)
Bilirubin Total: 0.4 mg/dL (ref 0.0–1.2)
CO2: 22 mmol/L (ref 20–29)
Calcium: 9 mg/dL (ref 8.7–10.2)
Chloride: 103 mmol/L (ref 96–106)
Creatinine, Ser: 0.74 mg/dL (ref 0.57–1.00)
Globulin, Total: 2.4 g/dL (ref 1.5–4.5)
Glucose: 89 mg/dL (ref 70–99)
Potassium: 3.9 mmol/L (ref 3.5–5.2)
Sodium: 136 mmol/L (ref 134–144)
Total Protein: 6.7 g/dL (ref 6.0–8.5)
eGFR: 102 mL/min/1.73 (ref >59)

## 2024-08-30 LAB — LIPID PANEL
Chol/HDL Ratio: 2.6 ratio (ref 0.0–4.4)
Cholesterol, Total: 165 mg/dL (ref 100–199)
HDL: 63 mg/dL (ref >39)
LDL Chol Calc (NIH): 88 mg/dL (ref 0–99)
Triglycerides: 73 mg/dL (ref 0–149)
VLDL Cholesterol Cal: 14 mg/dL (ref 5–40)

## 2024-09-05 ENCOUNTER — Ambulatory Visit
Admission: RE | Admit: 2024-09-05 | Discharge: 2024-09-05 | Disposition: A | Discharge status: Home / Self Care | Source: Ambulatory Visit | Attending: Family Medicine | Admitting: Family Medicine

## 2024-09-05 ENCOUNTER — Ambulatory Visit: Payer: Self-pay | Admitting: Family Medicine

## 2024-09-05 ENCOUNTER — Encounter: Payer: Self-pay | Admitting: Family Medicine

## 2024-09-05 DIAGNOSIS — R1011 Right upper quadrant pain: Secondary | ICD-10-CM | POA: Insufficient documentation

## 2024-09-05 DIAGNOSIS — K760 Fatty (change of) liver, not elsewhere classified: Secondary | ICD-10-CM | POA: Insufficient documentation

## 2024-10-23 ENCOUNTER — Other Ambulatory Visit: Payer: Self-pay

## 2024-10-23 ENCOUNTER — Other Ambulatory Visit: Payer: Self-pay | Admitting: Family Medicine

## 2024-10-23 DIAGNOSIS — Z6839 Body mass index (BMI) 39.0-39.9, adult: Secondary | ICD-10-CM

## 2024-10-23 MED ORDER — PHENTERMINE HCL 37.5 MG PO CAPS
37.5000 mg | ORAL_CAPSULE | ORAL | 0 refills | Status: AC
  Filled 2024-10-23: qty 30, 30d supply, fill #0

## 2024-11-07 ENCOUNTER — Ambulatory Visit: Admitting: Family Medicine

## 2024-11-08 ENCOUNTER — Encounter: Payer: Self-pay | Admitting: Family Medicine

## 2024-11-08 ENCOUNTER — Ambulatory Visit: Admitting: Family Medicine

## 2024-11-08 VITALS — BP 122/82 | HR 75 | Ht 66.0 in | Wt 243.2 lb

## 2024-11-08 DIAGNOSIS — R748 Abnormal levels of other serum enzymes: Secondary | ICD-10-CM | POA: Diagnosis not present

## 2024-11-08 DIAGNOSIS — K760 Fatty (change of) liver, not elsewhere classified: Secondary | ICD-10-CM | POA: Diagnosis not present

## 2024-11-08 DIAGNOSIS — R1011 Right upper quadrant pain: Secondary | ICD-10-CM | POA: Diagnosis not present

## 2024-11-08 DIAGNOSIS — E890 Postprocedural hypothyroidism: Secondary | ICD-10-CM

## 2024-11-08 NOTE — Progress Notes (Signed)
 "  Established Patient Office Visit  Patient ID: Brooke Gordon, female    DOB: 1981/06/24  Age: 44 y.o. MRN: [id number] PCP: Sharma Coyer, MD  Chief Complaint  Patient presents with   Follow-up    2 month follow-up for RUQ Pain. She reports it comes and goes. States it has gotten better but still has twinges of discomfort but has really tried watching bowel movements to see if it was infectious    Subjective:     HPI  Discussed the use of AI scribe software for clinical note transcription with the patient, who gave verbal consent to proceed.  History of Present Illness Brooke Gordon is a 44 year old female with hepatic steatosis who presents with right upper quadrant abdominal pain.  She has been experiencing right upper quadrant abdominal pain that is not constant and has improved over the past three to four weeks. The pain occasionally radiates to her back but has been less consistent recently. No epigastric pain, heartburn, or other gastrointestinal symptoms. An ultrasound two months ago showed hepatic steatosis without focal lesions or gallstones. She avoids foods that cause gas and has not used Gas-X medication.  She has a history of hepatic steatosis, first noted in 2017 with elevated liver enzymes and fatty infiltration. Her recent ultrasound did not comment on fatty liver changes, and her liver enzymes are currently normal. She also has a history of elevated parathyroid levels.  She is currently taking Synthroid  and has recently restarted phentermine  after stopping it over the holidays. Her physical activity has been limited due to a broken right pinky toe, injured four weeks ago, which remains swollen and painful, affecting her ability to walk. She has buddy-taped the toe.  She is concerned about her weight, noting weight gain and difficulty losing weight despite medication. She is also worried about her blood pressure. She has a history of cancer and experiences  anxiety related to her health.  She is a mother of a seven-year-old and is planning to return to school for a master's in healthcare administration.    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   Prediabetes 11/29/2022   Elevated alkaline phosphatase level 09/16/2021   Chronic fatigue 09/16/2021   Annual physical exam 09/16/2021   Polyp of colon    Abnormal uterine bleeding (AUB)    Abnormal vaginal Pap smear 07/12/2016   Anemia 07/12/2016   Hypothyroidism 07/12/2016   Non-atypical endometrial cells on cervical Pap smear 08/06/2014   Vitamin D  deficiency 08/06/2014   Hepatic steatosis 05/09/2014      ROS    Objective:     BP 122/82 (BP Location: Left Arm, Patient Position: Sitting, Cuff Size: Large)   Pulse 75   Ht 5' 6 (1.676 m)   Wt 243 lb 3.2 oz (110.3 kg)   SpO2 99%   BMI 39.25 kg/m   BP Readings from Last 3 Encounters:  11/08/24 122/82  08/29/24 130/76  06/27/24 (!) 142/90   Wt Readings from Last 3 Encounters:  11/08/24 243 lb 3.2 oz (110.3 kg)  08/29/24 238 lb 6.4 oz (108.1 kg)  06/27/24 250 lb 9.6 oz (113.7 kg)      Physical Exam Vitals reviewed.  Constitutional:      General: She is not  in acute distress.    Appearance: Normal appearance. She is not ill-appearing.  Cardiovascular:     Rate and Rhythm: Normal rate and regular rhythm.  Pulmonary:     Effort: Pulmonary effort is normal. No respiratory distress.     Breath sounds: No wheezing, rhonchi or rales.  Neurological:     Mental Status: She is alert and oriented to person, place, and time.  Psychiatric:        Mood and Affect: Mood normal.        Behavior: Behavior normal.      No results found for any visits on 11/08/24.  Last CBC Lab Results  Component Value Date    WBC 7.7 11/29/2022   HGB 12.3 11/29/2022   HCT 37.7 11/29/2022   MCV 88 11/29/2022   MCH 28.7 11/29/2022   RDW 12.9 11/29/2022   PLT 261 11/29/2022   Last metabolic panel Lab Results  Component Value Date   GLUCOSE 89 08/29/2024   NA 136 08/29/2024   K 3.9 08/29/2024   CL 103 08/29/2024   CO2 22 08/29/2024   BUN 8 08/29/2024   CREATININE 0.74 08/29/2024   EGFR 102 08/29/2024   CALCIUM  9.0 08/29/2024   PHOS 3.7 06/26/2015   PROT 6.7 08/29/2024   ALBUMIN 4.3 08/29/2024   LABGLOB 2.4 08/29/2024   AGRATIO 1.7 11/29/2022   BILITOT 0.4 08/29/2024   ALKPHOS 100 08/29/2024   AST 14 08/29/2024   ALT 11 08/29/2024   ANIONGAP 6 07/11/2016   Last lipids Lab Results  Component Value Date   CHOL 165 08/29/2024   HDL 63 08/29/2024   LDLCALC 88 08/29/2024   TRIG 73 08/29/2024   CHOLHDL 2.6 08/29/2024   Last hemoglobin A1c Lab Results  Component Value Date   HGBA1C 5.3 06/27/2024      The 10-year ASCVD risk score (Arnett DK, et al., 2019) is: 0.4%  Outpatient Encounter Medications as of 11/08/2024  Medication Sig   levothyroxine  (SYNTHROID ) 200 MCG tablet Take 1 tablet (200 mcg total) by mouth daily. Take on an empty stomach with a glass of water at least 30-60 minutes before breakfast.   phentermine  37.5 MG capsule Take 1 capsule (37.5 mg total) by mouth every morning.   topiramate  (TOPAMAX ) 25 MG tablet Take 1 tablet (25 mg total) by mouth 2 (two) times daily.   No facility-administered encounter medications on file as of 11/08/2024.       Assessment & Plan:   Problem List Items Addressed This Visit     Elevated alkaline phosphatase level - Primary   Hepatic steatosis   Hypothyroidism   RUQ pain    Assessment and Plan Assessment & Plan Right upper quadrant abdominal pain Chronic, improved  Intermittent right upper quadrant abdominal pain, less consistent over the past 3-4 weeks. No epigastric pain, heartburn, or radiation to the back. Previous ultrasound  showed no gallstones or gallbladder wall thickening. Differential includes gastrointestinal causes such as gas or IBS. No pancreatic involvement suspected due to normal liver enzymes and lack of epigastric symptoms. - Use peppermint tea and Gas-X for symptomatic relief. - Consider papaya enzyme supplements for digestion. - If pain becomes consistent, will consider CT scan for further evaluation.  Hepatic steatosis Chronic  No focal lesions or significant liver enzyme abnormalities. Weight loss is beneficial for managing hepatic steatosis. - Encouraged weight loss through diet and exercise.  Postablative hypothyroidism Chronic  Managed with Synthroid . Blood pressure slightly elevated, possibly related to weight and medication. Thyroid   function expected to be lower due to hypothyroidism, but blood pressure management is crucial. - Continue Synthroid  as prescribed.    Return in about 8 months (around 07/09/2025) for CPE.    Rockie Agent, MD [facility]  "

## 2024-11-08 NOTE — Patient Instructions (Signed)
 To keep you healthy, please keep in mind the following health maintenance items that you are due for:   Health Maintenance Due  Topic Date Due   COVID-19 Vaccine (3 - 2025-26 season) 06/18/2024   Mammogram  12/16/2024     Best Wishes,   Dr. Lang

## 2024-11-12 ENCOUNTER — Encounter

## 2024-11-15 ENCOUNTER — Ambulatory Visit
Admission: RE | Admit: 2024-11-15 | Discharge: 2024-11-15 | Disposition: A | Discharge status: Home / Self Care | Source: Ambulatory Visit | Attending: Family Medicine | Admitting: Family Medicine

## 2024-11-15 DIAGNOSIS — Z1231 Encounter for screening mammogram for malignant neoplasm of breast: Secondary | ICD-10-CM | POA: Diagnosis present

## 2024-11-20 ENCOUNTER — Other Ambulatory Visit: Payer: Self-pay | Admitting: Family Medicine

## 2024-11-20 DIAGNOSIS — R928 Other abnormal and inconclusive findings on diagnostic imaging of breast: Secondary | ICD-10-CM

## 2024-11-21 ENCOUNTER — Encounter: Payer: Self-pay | Admitting: Family Medicine

## 2024-11-23 ENCOUNTER — Telehealth: Admitting: Family Medicine

## 2024-11-23 ENCOUNTER — Encounter: Payer: Self-pay | Admitting: Family Medicine

## 2024-11-23 NOTE — Progress Notes (Unsigned)
" ° ° °                   MyChart Video Visit    Virtual Visit via Video Note   This format is felt to be most appropriate for this patient at this time. Physical exam was limited by quality of the video and audio technology used for the visit.    Patient location: home Provider location: [facility] Persons involved in the visit: patient, provider  I discussed the limitations of evaluation and management by telemedicine and the availability of in person appointments. The patient expressed understanding and agreed to proceed.  Patient: Brooke Gordon   DOB: 07-11-1981   44 y.o. Female  MRN: [id number] Visit Date: 11/23/2024  Today's healthcare provider: Rockie Agent, MD   Chief Complaint  Patient presents with   imaging results     Patient is present to discuss results and follow up of mammogram  Last text sent 3:50 PM To:[phone]     Subjective:    HPI  Discussed the use of AI scribe software for clinical note transcription with the patient, who gave verbal consent to proceed.  History of Present Illness    ROS  {Insert previous labs (optional):23779} {See past labs  Heme  Chem  Endocrine  Serology  Results Review (optional):1}    Objective:    LMP 11/23/2024 (Exact Date)   {Insert last BP/Wt (optional):23777}{See vitals history (optional):1}    Physical Exam Vitals reviewed.  Constitutional:      General: She is not in acute distress.    Appearance: Normal appearance. She is not ill-appearing.  Pulmonary:     Effort: Pulmonary effort is normal. No respiratory distress.  Neurological:     Mental Status: She is alert and oriented to person, place, and time.  Psychiatric:        Mood and Affect: Mood normal.        Behavior: Behavior normal.        Thought Content: Thought content normal.     {PhysExam Abridge (Optional):210964309}    Assessment & Plan:    Problem List Items Addressed This Visit    None   Assessment and Plan Assessment & Plan     No orders of the defined types were placed in this encounter.    No follow-ups on file.     I discussed the assessment and treatment plan with the patient. The patient was provided an opportunity to ask questions and all were answered. The patient agreed with the plan and demonstrated an understanding of the instructions.   The patient was advised to call back or seek an in-person evaluation if the symptoms worsen or if the condition fails to improve as anticipated.  I provided *** minutes of non-face-to-face time during this encounter.  Rockie Agent, MD [facility]    "

## 2024-11-28 ENCOUNTER — Other Ambulatory Visit

## 2024-11-28 ENCOUNTER — Encounter
